# Patient Record
Sex: Female | Born: 1970 | Race: White | Hispanic: No | Marital: Married | Smoking: Former smoker
Health system: Southern US, Community
[De-identification: ages and names within clinical notes are randomized; demographics above are authoritative.]

## PROBLEM LIST (undated history)

## (undated) DIAGNOSIS — E7800 Pure hypercholesterolemia, unspecified: Secondary | ICD-10-CM

## (undated) DIAGNOSIS — M1990 Unspecified osteoarthritis, unspecified site: Secondary | ICD-10-CM

## (undated) DIAGNOSIS — T8859XA Other complications of anesthesia, initial encounter: Secondary | ICD-10-CM

## (undated) DIAGNOSIS — E119 Type 2 diabetes mellitus without complications: Secondary | ICD-10-CM

## (undated) DIAGNOSIS — B009 Herpesviral infection, unspecified: Secondary | ICD-10-CM

## (undated) DIAGNOSIS — K219 Gastro-esophageal reflux disease without esophagitis: Secondary | ICD-10-CM

## (undated) DIAGNOSIS — F419 Anxiety disorder, unspecified: Secondary | ICD-10-CM

## (undated) DIAGNOSIS — I10 Essential (primary) hypertension: Secondary | ICD-10-CM

## (undated) HISTORY — PX: WISDOM TOOTH EXTRACTION: SHX21

## (undated) HISTORY — DX: Essential (primary) hypertension: I10

## (undated) HISTORY — PX: DILATION AND CURETTAGE OF UTERUS: SHX78

## (undated) HISTORY — PX: TUBAL LIGATION: SHX77

## (undated) HISTORY — PX: TONSILLECTOMY: SUR1361

## (undated) HISTORY — DX: Herpesviral infection, unspecified: B00.9

## (undated) HISTORY — DX: Pure hypercholesterolemia, unspecified: E78.00

---

## 1990-03-06 HISTORY — PX: WISDOM TOOTH EXTRACTION: SHX21

## 2005-01-08 ENCOUNTER — Inpatient Hospital Stay: Admission: AD | Admit: 2005-01-08 | Discharge: 2005-01-08 | Payer: Self-pay | Admitting: Gynecology

## 2005-01-09 ENCOUNTER — Inpatient Hospital Stay (HOSPITAL_COMMUNITY): Admission: RE | Admit: 2005-01-09 | Discharge: 2005-01-12 | Payer: Self-pay | Admitting: Gynecology

## 2005-01-09 ENCOUNTER — Encounter (INDEPENDENT_AMBULATORY_CARE_PROVIDER_SITE_OTHER): Payer: Self-pay | Admitting: *Deleted

## 2006-06-05 ENCOUNTER — Other Ambulatory Visit: Admission: RE | Admit: 2006-06-05 | Discharge: 2006-06-05 | Payer: Self-pay | Admitting: Gynecology

## 2007-06-27 ENCOUNTER — Other Ambulatory Visit: Admission: RE | Admit: 2007-06-27 | Discharge: 2007-06-27 | Payer: Self-pay | Admitting: Gynecology

## 2009-08-19 ENCOUNTER — Ambulatory Visit: Payer: Self-pay | Admitting: Gynecology

## 2009-08-19 ENCOUNTER — Other Ambulatory Visit: Admission: RE | Admit: 2009-08-19 | Discharge: 2009-08-19 | Payer: Self-pay | Admitting: Gynecology

## 2009-08-20 ENCOUNTER — Emergency Department (HOSPITAL_COMMUNITY): Admission: EM | Admit: 2009-08-20 | Discharge: 2009-08-21 | Payer: Self-pay | Admitting: Emergency Medicine

## 2009-11-17 ENCOUNTER — Ambulatory Visit: Payer: Self-pay | Admitting: Gynecology

## 2010-05-22 LAB — DIFFERENTIAL
Basophils Absolute: 0 10*3/uL (ref 0.0–0.1)
Basophils Relative: 0 % (ref 0–1)
Eosinophils Absolute: 0.1 10*3/uL (ref 0.0–0.7)
Eosinophils Relative: 1 % (ref 0–5)
Lymphocytes Relative: 9 % — ABNORMAL LOW (ref 12–46)
Lymphs Abs: 0.9 10*3/uL (ref 0.7–4.0)
Monocytes Absolute: 0.6 10*3/uL (ref 0.1–1.0)

## 2010-05-22 LAB — CBC
Hemoglobin: 12.6 g/dL (ref 12.0–15.0)
MCV: 90.6 fL (ref 78.0–100.0)
RBC: 4.11 MIL/uL (ref 3.87–5.11)
RDW: 13.4 % (ref 11.5–15.5)
WBC: 10.1 10*3/uL (ref 4.0–10.5)

## 2010-05-22 LAB — URINALYSIS, ROUTINE W REFLEX MICROSCOPIC
Bilirubin Urine: NEGATIVE
Hgb urine dipstick: NEGATIVE
Protein, ur: NEGATIVE mg/dL
Urobilinogen, UA: 1 mg/dL (ref 0.0–1.0)
pH: 7 (ref 5.0–8.0)

## 2010-05-22 LAB — COMPREHENSIVE METABOLIC PANEL
Albumin: 4 g/dL (ref 3.5–5.2)
Alkaline Phosphatase: 63 U/L (ref 39–117)
Potassium: 3.8 mEq/L (ref 3.5–5.1)
Total Protein: 7.1 g/dL (ref 6.0–8.3)

## 2010-05-22 LAB — URINE MICROSCOPIC-ADD ON

## 2010-07-22 NOTE — H&P (Signed)
NAME:  Latasha Shepard, Latasha Shepard            ACCOUNT NO.:  [id number]   MEDICAL RECORD NO.:  [id number]          PATIENT TYPE:  INP   LOCATION:  NA                            [facility]   PHYSICIAN:  Timothy P. Fontaine, M.D.DATE OF BIRTH:  1970-09-28   DATE OF ADMISSION:  01/09/2005  DATE OF DISCHARGE:                                HISTORY & PHYSICAL   CHIEF COMPLAINT:  1.  Pregnancy at term.  2.  History of prior cesarean section, for repeat cesarean section,  3.  Desires permanent sterilization.  4.  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  5.  Herpes simplex virus.  6.  Type 2 diabetes.   HISTORY OF PRESENT ILLNESS:  The patient is a 40 year old G3, P2 female with  history of two prior cesarean sections at term gestation who desires repeat  cesarean section and tubal sterilization. She is known to have a positive  beta strep history with the previous pregnancy, although has a negative beta  strep culture this pregnancy.  For the remainder of her history, see her  hollister.   PHYSICAL EXAMINATION:  HEENT: Normal.  LUNGS:  Clear.  CARDIAC:  Regular rate.  No rubs, murmurs or gallops.  ABDOMEN:  Gravid, term fundus positive fetal heart tones.  PELVIC:  Deferred.   ASSESSMENT:  A 40 year old G62, P2 female with a history of prior cesarean  section x2 for repeat cesarean section and tubal sterilization. I reviewed  with the patient the options to include trial of labor versus repeat  cesarean section. The expected intraoperative and postoperative courses were  discussed. The absolute sterility associated with a tubal sterilization was  reviewed, the permanency, as well as the potential for failure in the  future, all of which she understands and accepts. The risks of bleeding  necessitating transfusion and the risks of transfusion were reviewed. The  risks of infection, both internal requiring prolonged antibiotics,  incisional requiring opening and draining of incisions, and  closure by  secondary intention was discussed, understood, and accepted. The risks of  inadvertent injury to internal organs including bowel, bladder, ureters,  vessels and nerves necessitating major exploratory reparative surgeries,  future reparative surgeries including ostomy formation, was all discussed,  understood and accepted. The risks of fetal injury to include  musculoskeletal, neural and scalpel injuries were all discussed, understood  and accepted. The patient's questions were answered to her satisfaction. She  is ready to proceed with surgery.      Timothy P. Fontaine, M.D.  Electronically Signed    TPF/MEDQ  D:  12/22/2004  T:  12/22/2004  Job:  [id number]

## 2010-07-22 NOTE — Op Note (Signed)
NAME:  Latasha Shepard, Latasha Shepard            ACCOUNT NO.:  [id number]   MEDICAL RECORD NO.:  [id number]          PATIENT TYPE:  INP   LOCATION:  9124                          [facility]   PHYSICIAN:  Timothy P. Fontaine, M.D.DATE OF BIRTH:  1970-08-18   DATE OF PROCEDURE:  01/09/2005  DATE OF DISCHARGE:                                 OPERATIVE REPORT   PREOPERATIVE DIAGNOSES:  1.  Pregnancy at term.  2.  History of prior cesarean section for repeat cesarean section.  3.  Desires permanent sterilization.  4.  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  5.  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   POSTOPERATIVE DIAGNOSES:  Not given.   PROCEDURE:  1.  Repeat low transverse cervical cesarean section.  2.  Bilateral tubal sterilization, modified Pomeroy technique.   SURGEON:  Timothy P. Fontaine, M.D.   ASSISTANT:  Ivor Costa. Farrel Gobble, M.D.   ANESTHETIC:  Epidural.   COMPLICATIONS:  None.   ESTIMATED BLOOD LOSS:  Less than 500 mL.   SPECIMEN:  1.  Samples of cord blood  2.  Portions of right and portions of left fallopian tube.  3.  Cord blood banking   FINDINGS:  At 3 normal female infant, Apgar's 9 and 9, weight 8 pounds 12  ounces. Pelvic anatomy noted to be normal.   DESCRIPTION OF PROCEDURE:  The patient was taken to the operating room,  underwent epidural anesthesia and was placed in left tilt supine position,  received an abdominal preparation with Betadine solution. Bladder emptied  with an indwelling Foley catheterization, placed in sterile technique per  nursing personnel. The patient was draped in the usual fashion and after  assuring adequate anesthesia, the abdomen was sharply entered through a  repeat Pfannenstiel incision achieving adequate hemostasis at all levels.  The bladder flap was sharply and bluntly developed without difficulty,  uterus sharply entered in the lower uterine segment and bluntly extended  laterally. Bulging membranes were ruptured, the  fluid noted to be clear. The  infant's head delivered through the incision and nares and mouth suctioned.  The rest of the infant delivered, the cord doubly clamped and cut and the  infant was handed to pediatrics in attendance. Samples of cord blood were  obtained. The placenta was spontaneously extruded and sent for cord blood  banking processing. The uterus was exteriorized, the endometrial cavity  explored with a sponge to remove all placental membrane fragments. The  uterine incision was closed in two layers using #0 Vicryl suture first in a  running interlocking stitch followed by an imbricating stitch. The right  fallopian tube was then identified, traced from its insertion to its  fimbriated end and a mid tubal segment was doubly ligated using #0 plain  suture and the intervening segment excised. Adequate hemostasis as well as  tubal lumen was grossly identified. A similar procedure was carried out on  the other side. The uterus was then returned to the abdomen. Both tubal  sites reinspected to assure adequate hemostasis, the abdomen copiously  irrigated. Adequate hemostasis visualized and the anterior fascia was  reapproximated using #  0 Vicryl suture in a running stitch starting at the  angle and meeting in the middle. The subcutaneous tissues were irrigated and  adequate hemostasis achieved with electrocautery. The skin reapproximated  using 4-0 Vicryl in a running subcuticular stitch. Steri-Strips and Benzoin  applied, sterile dressing applied and the patient was taken to the recovery  room in good condition having tolerated the procedure well.      Timothy P. Fontaine, M.D.  Electronically Signed     TPF/MEDQ  D:  01/09/2005  T:  01/09/2005  Job:  [id number]

## 2010-07-22 NOTE — Discharge Summary (Signed)
NAME:  Latasha Shepard, Latasha Shepard            ACCOUNT NO.:  [id number]   MEDICAL RECORD NO.:  [id number]          PATIENT TYPE:  INP   LOCATION:  9124                          [facility]   PHYSICIAN:  Ivor Costa. Farrel Gobble, M.D. DATE OF BIRTH:  28-Jan-1971   DATE OF ADMISSION:  01/09/2005  DATE OF DISCHARGE:                                 DISCHARGE SUMMARY   PRINCIPAL DIAGNOSIS:  Term pregnancy for elective repeat cesarean section.   PRINCIPAL PROCEDURE:  Repeat cesarean section.   ADDITIONAL PROCEDURE:  Bilateral tubal ligation.   HOSPITAL COURSE:  The patient was admitted electively in the morning of  January 09, 2005, and underwent an elective repeat cesarean section with  bilateral tubal ligation by Dr. Audie Box under epidural anesthesia for  delivery of a viable female in the vertex presentation with Apgars of 9 and 9,  birth weight of 8 pounds 12ounces, followed by a bilateral tubal ligation.  Her postoperative course was unremarkable. By postoperative day #3 the  patient was ambulating without difficulty, tolerating a regular diet,  feeding without difficulty, and ready for discharge. Her postoperative exam,  she was afebrile, her vitals were stable. Her abdomen was soft, nontender.  Incision was clean, dry, and intact. Her uterus was firm, nontender, below  the umbilicus. The extremities were also nontender.   DISCHARGE LABORATORY:  Her hemoglobin was 9.9, her hematocrit was 29, her  white count was 10.8, and her platelets were 198.   The patient was discharged home in stable condition with instructions to  follow up in the office in 6 weeks. She was given a prescription for  Percocet preoperatively. She will also use over-the-counter Motrin as  needed.      Ivor Costa. Farrel Gobble, M.D.  Electronically Signed     THL/MEDQ  D:  01/12/2005  T:  01/12/2005  Job:  [id number]

## 2010-07-22 NOTE — H&P (Signed)
NAME:  Latasha Shepard, Latasha Shepard            ACCOUNT NO.:  [id number]   MEDICAL RECORD NO.:  [id number]          PATIENT TYPE:  INP   LOCATION:  9124                          [facility]   PHYSICIAN:  Timothy P. Fontaine, M.D.DATE OF BIRTH:  [birth year]   DATE OF ADMISSION:  01/09/2005  DATE OF DISCHARGE:  01/12/2005                                HISTORY & PHYSICAL   CHIEF COMPLAINT:  1.  Pregnancy at term.  2.  Prior cesarean section x2 for repeat cesarean section.  3.  Desires permanent sterilization.  4.  Positive beta streptococcus carrier.  5.  History of herpes simplex virus carrier.   HISTORY OF PRESENT ILLNESS:  This is a redictation of her history and  physical which apparently was lost in dictation. The patient is a 40-year-  old G1 P2 female at term gestation. History of two prior cesarean sections  for repeat cesarean section and tubal sterilization. The patient has been  counseled as to the risks, benefits, indications and alternatives; the  permanency of sterilization; potential for failure - all of which she  understands and accepts. For the remainder of her history, see her  Hollister.   PHYSICAL EXAMINATION:  HEENT: Normal.  LUNGS:  Clear.  CARDIAC:  Regular rate. No rubs, murmurs or gallops.  ABDOMEN:  Gravid, term uterus. Positive fetal heart tones.  PELVIC:  Deferred.   ASSESSMENT AND PLAN:  A 40 year old G52 P2 female, two prior cesarean  sections, for repeat cesarean section and tubal sterilization. The patient  was counseled for the surgery to include the acute  intraoperative/postoperative risks, as well as the tubal sterilization to  include the permanency and failure risks. The patient's questions were  answered and she is ready to proceed with surgery.   Of note, again, this is a redictation of her history and physical which  apparently had been lost.      Timothy P. Fontaine, M.D.  Electronically Signed     TPF/MEDQ  D:  01/26/2005  T:  01/26/2005   Job:  [id number]

## 2010-11-02 ENCOUNTER — Emergency Department (HOSPITAL_COMMUNITY): Payer: Self-pay

## 2010-11-02 ENCOUNTER — Emergency Department (HOSPITAL_COMMUNITY)
Admission: EM | Admit: 2010-11-02 | Discharge: 2010-11-02 | Disposition: A | Discharge status: Home / Self Care | Payer: Self-pay | Attending: Emergency Medicine | Admitting: Emergency Medicine

## 2010-11-02 ENCOUNTER — Emergency Department (HOSPITAL_COMMUNITY)
Admission: EM | Admit: 2010-11-02 | Discharge: 2010-11-02 | Disposition: A | Discharge status: Other Acute Inpatient Hospital | Payer: Self-pay | Attending: Emergency Medicine | Admitting: Emergency Medicine

## 2010-11-02 DIAGNOSIS — F29 Unspecified psychosis not due to a substance or known physiological condition: Secondary | ICD-10-CM | POA: Insufficient documentation

## 2010-11-02 DIAGNOSIS — G43109 Migraine with aura, not intractable, without status migrainosus: Secondary | ICD-10-CM | POA: Insufficient documentation

## 2010-11-02 DIAGNOSIS — H539 Unspecified visual disturbance: Secondary | ICD-10-CM | POA: Insufficient documentation

## 2010-11-02 DIAGNOSIS — H538 Other visual disturbances: Secondary | ICD-10-CM | POA: Insufficient documentation

## 2010-11-02 DIAGNOSIS — R4182 Altered mental status, unspecified: Secondary | ICD-10-CM | POA: Insufficient documentation

## 2010-11-02 DIAGNOSIS — F411 Generalized anxiety disorder: Secondary | ICD-10-CM | POA: Insufficient documentation

## 2010-11-02 LAB — COMPREHENSIVE METABOLIC PANEL
ALT: 28 U/L (ref 0–35)
AST: 21 U/L (ref 0–37)
Albumin: 4.1 g/dL (ref 3.5–5.2)
Alkaline Phosphatase: 71 U/L (ref 39–117)
BUN: 9 mg/dL (ref 6–23)
CO2: 25 mEq/L (ref 19–32)
Chloride: 101 mEq/L (ref 96–112)
Creatinine, Ser: 0.7 mg/dL (ref 0.50–1.10)
Glucose, Bld: 129 mg/dL — ABNORMAL HIGH (ref 70–99)
Potassium: 3.4 mEq/L — ABNORMAL LOW (ref 3.5–5.1)
Sodium: 136 mEq/L (ref 135–145)
Total Bilirubin: 0.5 mg/dL (ref 0.3–1.2)
Total Protein: 7.6 g/dL (ref 6.0–8.3)

## 2010-11-02 LAB — POCT I-STAT, CHEM 8
Calcium, Ion: 1.06 mmol/L — ABNORMAL LOW (ref 1.12–1.32)
Chloride: 105 mEq/L (ref 96–112)
Creatinine, Ser: 0.8 mg/dL (ref 0.50–1.10)
HCT: 37 % (ref 36.0–46.0)
TCO2: 21 mmol/L (ref 0–100)

## 2010-11-02 LAB — CBC
MCHC: 33.7 g/dL (ref 30.0–36.0)
WBC: 9.3 10*3/uL (ref 4.0–10.5)

## 2010-11-02 LAB — CK TOTAL AND CKMB (NOT AT ARMC)
CK, MB: 1.5 ng/mL (ref 0.3–4.0)
Relative Index: INVALID (ref 0.0–2.5)
Total CK: 89 U/L (ref 7–177)

## 2010-11-02 LAB — DIFFERENTIAL
Basophils Relative: 1 % (ref 0–1)
Lymphs Abs: 2.5 10*3/uL (ref 0.7–4.0)
Monocytes Relative: 7 % (ref 3–12)
Neutro Abs: 6 10*3/uL (ref 1.7–7.7)

## 2010-11-07 NOTE — Consult Note (Signed)
NAME:  Latasha Shepard, Latasha Shepard NO.:  [id number]  MEDICAL RECORD NO.:  [id number]  LOCATION:  [facility]                         FACILITY:  [facility]  PHYSICIAN:  Thana Farr, MD    DATE OF BIRTH:  03/18/1970  DATE OF CONSULTATION:  11/02/2010 DATE OF DISCHARGE:                                CONSULTATION   REFERRING PHYSICIAN:  April Palumbo-Rasch, MD  HISTORY:  Latasha Shepard is a 40 year old female who was in class today and had acute onset of difficulty seeing in Latasha right of Latasha instructor's face.  This area spread outward and upward.  Her visual problems lasted for approximately 15 minutes.  During this time, Latasha Shepard was also confused and in Latasha elevator could not figure out what button to push.  At Latasha parking lot could not find her car and was somewhat anxious as well.  Symptoms resolved after approximately 15 minutes.  Latasha Shepard presented to Lat[facility] ED as a code stroke.  Latasha Shepard now has a pounding headache.  She rates Latasha headache at approximately 3/10.  There is no associated nausea and vomiting at this time.  PAST MEDICAL HISTORY:  Shingles and menstrual migraines.  She has not had a migraine approximately 3 months.  MEDICATIONS:  Acyclovir and Imitrex p.r.n.  SOCIAL HISTORY:  Latasha Shepard has no history of alcohol, tobacco, or illicit drug abuse.  She is married.  PHYSICAL EXAMINATION:  VITAL SIGNS:  Blood pressure 142/92, heart rate 88, and respiratory rate 16. NEUROLOGIC:  Mental status:  Latasha Shepard is alert and oriented.  She can follow commands without difficulty.  Speech is fluent.  Cranial nerve testing II:  Visual field is grossly intact.  Disks flat bilaterally. III, IV, and VI:  Pupils are reactive bilaterally as well as extraocular movements intact.  V and VII:  Smile symmetric.  VIII:  Grossly intact. IX and VII:  Positive gag.  XI:  Bilateral shoulder shrug.  XII: Midline tongue extension.  On motor exam, Latasha Shepard is 5/5  throughout. There is normal tone and bulk.  On sensory testing, pinprick and light touch intact bilaterally.  Deep tendon reflexes are 1+ throughout with absent ankle jerks.  Plantars are downgoing bilaterally.  On cerebellar testing, finger-to-nose and heel-to-shin intact.  LABORATORY DATA:  Sodium 137, potassium 3.5, chloride 105, bicarb 25, BUN 8, creatinine 0.8, and glucose 128.  White blood cell count 9.3, platelet count 309, hemoglobin and hematocrit 12.6 and 37.0 respectively.  PT/INR and PTT 14, 1.06, and 32 respectively.  Total bili 0.5, alk phos 71, SGOT and SGPT 21 and 28 respectively.  Total protein 7.6, albumin 4.1, and calcium 9.4.  Head CT without contrast shows no abnormalities.  ASSESSMENT:  [name] is a 40 year old female with a history of migraines who had presenting complaints of a right superior quadrantanopia and confusion.  Symptoms lasted approximately 15 minutes and resolved.  Latasha Shepard did present as a code stroke but due to her resolution of symptoms tPA was not administered.  Latasha Shepard is now back to baseline.  Differential includes migraine variant versustransient ischemic attack versus seizure.  PLAN: 1. Latasha Shepard will have MRI/MRA of Latasha brain. 2. If above imaging  is unremarkable, Latasha Shepard may be discharged to     home. 3. EEG to be performed.  May be performed as an outpatient if MRI is     unremarkable. 4. Latasha Shepard should refrain from use of Imitrex at this time if she     has further headache due to focal nature of these complaints.          ______________________________ Thana Farr, MD     LR/MEDQ  D:  11/02/2010  T:  11/02/2010  Job:  [id number]  Electronically Signed by Thana Farr MD on 11/07/2010 06:21:53 AM

## 2011-03-15 ENCOUNTER — Encounter: Payer: Self-pay | Admitting: *Deleted

## 2011-03-16 ENCOUNTER — Encounter: Payer: Self-pay | Admitting: Gynecology

## 2011-03-23 ENCOUNTER — Encounter: Payer: Self-pay | Admitting: Gynecology

## 2011-03-29 ENCOUNTER — Encounter: Payer: Self-pay | Admitting: Gynecology

## 2011-03-29 ENCOUNTER — Other Ambulatory Visit (HOSPITAL_COMMUNITY)
Admission: RE | Admit: 2011-03-29 | Discharge: 2011-03-29 | Disposition: A | Discharge status: Home / Self Care | Payer: Self-pay | Source: Ambulatory Visit | Attending: Gynecology | Admitting: Gynecology

## 2011-03-29 ENCOUNTER — Ambulatory Visit (INDEPENDENT_AMBULATORY_CARE_PROVIDER_SITE_OTHER): Payer: Self-pay | Admitting: Gynecology

## 2011-03-29 VITALS — BP 126/80 | Ht 65.0 in | Wt 245.0 lb

## 2011-03-29 DIAGNOSIS — R300 Dysuria: Secondary | ICD-10-CM

## 2011-03-29 DIAGNOSIS — Z13220 Encounter for screening for lipoid disorders: Secondary | ICD-10-CM

## 2011-03-29 DIAGNOSIS — Z131 Encounter for screening for diabetes mellitus: Secondary | ICD-10-CM

## 2011-03-29 DIAGNOSIS — N393 Stress incontinence (female) (male): Secondary | ICD-10-CM

## 2011-03-29 DIAGNOSIS — Z01419 Encounter for gynecological examination (general) (routine) without abnormal findings: Secondary | ICD-10-CM | POA: Insufficient documentation

## 2011-03-29 DIAGNOSIS — B009 Herpesviral infection, unspecified: Secondary | ICD-10-CM

## 2011-03-29 LAB — URINALYSIS W MICROSCOPIC + REFLEX CULTURE
Leukocytes, UA: NEGATIVE
Specific Gravity, Urine: 1.025 (ref 1.005–1.030)
Urobilinogen, UA: 1 mg/dL (ref 0.0–1.0)
pH: 6 (ref 5.0–8.0)

## 2011-03-29 LAB — CBC WITH DIFFERENTIAL/PLATELET
Basophils Relative: 1 % (ref 0–1)
Eosinophils Absolute: 0.2 10*3/uL (ref 0.0–0.7)
Lymphs Abs: 2.4 10*3/uL (ref 0.7–4.0)
MCH: 30.9 pg (ref 26.0–34.0)
Monocytes Absolute: 0.6 10*3/uL (ref 0.1–1.0)

## 2011-03-29 LAB — LIPID PANEL
HDL: 49 mg/dL (ref >39)
Total CHOL/HDL Ratio: 4.2 Ratio
VLDL: 21 mg/dL (ref 0–40)

## 2011-03-29 LAB — GLUCOSE, RANDOM: Glucose, Bld: 83 mg/dL (ref 70–99)

## 2011-03-29 MED ORDER — SULFAMETHOXAZOLE-TRIMETHOPRIM 800-160 MG PO TABS: 1.0000 | ORAL_TABLET | Freq: Once | ORAL | Status: AC

## 2011-03-29 MED ORDER — ACYCLOVIR 400 MG PO TABS: 400.0000 mg | ORAL_TABLET | ORAL | Status: DC

## 2011-03-29 NOTE — Patient Instructions (Signed)
Begin Kegel exercises.  Follow up with urology if you want to pursue bladder sling. Begin exercise program routinely with attention to diet. Follow up in one year for routine GYN exam.

## 2011-03-29 NOTE — Progress Notes (Signed)
Latasha Shepard [birth year] [id number]        41 y.o.  for annual exam.  Notes to issues #1 a day or so after intercourse she'll have end stream dysuria in frequency that lasts for about a day and then goes away.  Has been going on for multiple months. #2 classic SUI symptoms with loss of urine with coughing sneezing laughing.  Past medical history,surgical history, medications, allergies, family history and social history were all reviewed and documented in the EPIC chart. ROS:  Was performed and pertinent positives and negatives are included in the history.  Exam: Sherrilyn Rist chaperone present Filed Vitals:   03/29/11 1139  BP: 126/80   General appearance  Normal Skin grossly normal Head/Neck normal with no cervical or supraclavicular adenopathy thyroid normal Lungs  clear Cardiac RR, without RMG Abdominal  soft, nontender, without masses, organomegaly or hernia Breasts  examined lying and sitting without masses, retractions, discharge or axillary adenopathy. Pelvic  Ext/BUS/vagina  normal   Cervix  normal  Pap done  Uterus  anteverted, normal size, shape and contour, midline and mobile nontender   Adnexa  Without masses or tenderness    Anus and perineum  normal   Rectovaginal  normal sphincter tone without palpated masses or tenderness.    Assessment/Plan:  41 y.o. female for annual exam.   Status post BTL with regular menses. 1. Postcoital cystitis. Classic by history. Her UA today shows some concentration but no overt UTI. Will treat with Macrobid 100 mg postcoital see if this doesn't eliminate her complaint. #30 with several refills given. 2. 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  No overt cystocele.  Options for management include Kegel exercises or consideration for sling procedure reviewed. Patient wants to try Kegel exercises first. If she wants to pursue surgery then she will follow up with urology. 3. History of HSV 2. Patient uses acyclovir 400 mg intermittently for either  treatment with an outbreak she'll take several daily for a day or 2 which eradicates her recurrence or she will take it daily for suppression during a certain time. Acyclovir 400 mg #60 with several refills given. 4. Pap smear. Her last Pap smear 2011 should hyperkeratoses but otherwise was negative. Pap smear was done today. 5. Mammogram. She's never had a mammogram urged her to schedule this she agrees to do so. SBE monthly reviewed. 6. Health maintenance. Baseline CBC, urinalysis, lipid profile, glucose ordered. Assuming she continues well from a gynecologic standpoint and she will see me in a year, sooner as needed.    Dara Lords MD, 12:25 PM 03/29/2011

## 2011-03-30 ENCOUNTER — Other Ambulatory Visit: Payer: Self-pay | Admitting: *Deleted

## 2011-03-30 DIAGNOSIS — E7800 Pure hypercholesterolemia, unspecified: Secondary | ICD-10-CM

## 2011-04-06 ENCOUNTER — Other Ambulatory Visit: Payer: Self-pay

## 2013-07-18 ENCOUNTER — Telehealth: Payer: Self-pay | Admitting: *Deleted

## 2013-07-18 NOTE — Telephone Encounter (Signed)
Pt calling c/o vaginal bleeding since march 2015, last seen in Jan 2013. Pt informed OV best,transferred to front desk

## 2013-07-21 ENCOUNTER — Telehealth: Payer: Self-pay | Admitting: *Deleted

## 2013-07-21 NOTE — Telephone Encounter (Signed)
Pt said bleeding stopped on Friday, is going to cancel appointment for in am with JF

## 2013-07-22 ENCOUNTER — Ambulatory Visit: Payer: Self-pay | Admitting: Gynecology

## 2013-08-05 ENCOUNTER — Ambulatory Visit (INDEPENDENT_AMBULATORY_CARE_PROVIDER_SITE_OTHER): Payer: 59 | Admitting: Gynecology

## 2013-08-05 ENCOUNTER — Encounter: Payer: Self-pay | Admitting: Gynecology

## 2013-08-05 DIAGNOSIS — N926 Irregular menstruation, unspecified: Secondary | ICD-10-CM

## 2013-08-05 MED ORDER — MEGESTROL ACETATE 20 MG PO TABS: 20.0000 mg | ORAL_TABLET | Freq: Two times a day (BID) | ORAL | Status: DC

## 2013-08-05 NOTE — Progress Notes (Signed)
Latasha Shepard [birth year] [id number]        43 y.o.  G4P0013 presents with a history of regular menses until a skip in February. She suddenly started in March and has bled on and off since then. There is bleeding heavy with passage of clots at times. Status post BTL in the past. Recent blood work at her primary physician's last week showed a hemoglobin of 11 and a normal thyroid screen. No other symptoms such as hair loss, weight changes or skin changes.  Past medical history,surgical history, problem list, medications, allergies, family history and social history were all reviewed and documented in the EPIC chart.  Directed ROS with pertinent positives and negatives documented in the history of present illness/assessment and plan.  Exam: Kim assistant General appearance  Normal Abdomen soft without masses, tenderness or guarding. Pelvic external BUS vagina with small clots within the vagina. Cervix normal. Uterus grossly normal in size noting exam limited by abdominal girth. Adnexa without gross masses or tenderness.   Assessment/Plan:  43 y.o. G4P0013 recent episode of irregular bleeding following a skipped menses. Hemoglobin and TSH recently checked. Will check qualitative hCG for completeness. Megace 20 mg twice a day until bleeding slows then once a day until ultrasound. Schedule sonohysterogram in followup for this.   Note: This document was prepared with digital dictation and possible smart phrase technology. Any transcriptional errors that result from this process are unintentional.   Dara Lords MD, 11:37 AM 08/05/2013

## 2013-08-05 NOTE — Patient Instructions (Addendum)
Start the Megace medication twice daily and once the bleeding significantly decrease the dose to once daily. Followup for ultrasound as scheduled in several weeks.

## 2013-08-06 LAB — HCG, SERUM, QUALITATIVE: PREG SERUM: NEGATIVE

## 2013-08-11 ENCOUNTER — Other Ambulatory Visit: Payer: Self-pay | Admitting: Gynecology

## 2013-08-11 DIAGNOSIS — N920 Excessive and frequent menstruation with regular cycle: Secondary | ICD-10-CM

## 2013-08-11 DIAGNOSIS — N926 Irregular menstruation, unspecified: Secondary | ICD-10-CM

## 2013-08-13 ENCOUNTER — Encounter: Payer: Self-pay | Admitting: Gynecology

## 2013-08-13 ENCOUNTER — Ambulatory Visit (INDEPENDENT_AMBULATORY_CARE_PROVIDER_SITE_OTHER): Payer: 59

## 2013-08-13 ENCOUNTER — Other Ambulatory Visit: Payer: Self-pay | Admitting: Gynecology

## 2013-08-13 ENCOUNTER — Ambulatory Visit (INDEPENDENT_AMBULATORY_CARE_PROVIDER_SITE_OTHER): Payer: 59 | Admitting: Gynecology

## 2013-08-13 DIAGNOSIS — N926 Irregular menstruation, unspecified: Secondary | ICD-10-CM

## 2013-08-13 DIAGNOSIS — N852 Hypertrophy of uterus: Secondary | ICD-10-CM

## 2013-08-13 DIAGNOSIS — N8300 Follicular cyst of ovary, unspecified side: Secondary | ICD-10-CM

## 2013-08-13 DIAGNOSIS — N920 Excessive and frequent menstruation with regular cycle: Secondary | ICD-10-CM

## 2013-08-13 DIAGNOSIS — N921 Excessive and frequent menstruation with irregular cycle: Secondary | ICD-10-CM

## 2013-08-13 NOTE — Patient Instructions (Signed)
Call if irregular bleeding continues.

## 2013-08-13 NOTE — Progress Notes (Signed)
Tesha Folger Sep 01, 1970 [id number]        43 y.o.  G4P0013 presents for sonohysterogram with a history of regular menses until a skip in February. She suddenly started in March and has bled on and off since then. There is bleeding heavy with passage of clots at times. Status post BTL in the past. Recent blood work at her primary physician's last week showed a hemoglobin of 11 and a normal thyroid screen. No other symptoms such as hair loss, weight changes or skin changes. Was started on Megace reports that her bleeding has pretty much gone away.   Past medical history,surgical history, problem list, medications, allergies, family history and social history were all reviewed and documented in the EPIC chart.  Directed ROS with pertinent positives and negatives documented in the history of present illness/assessment and plan.  Ultrasound shows uterus normal size. Endometrial echo 7.3 mm. Right and left ovaries normal. Cul-de-sac negative.  Sonohysterogram performed, sterile technique, easy catheter introduction, good distention with no abnormalities. Endometrial sample taken. Patient tolerated well.  Assessment/Plan:  43 y.o. M1D6222 with history of irregular bleeding following a skipped menses. Suspect ovulatory dysfunction. We'll follow up for biopsy results. Keep menstrual calendar. If irregularity continues patient knows to call. If she goes more than 6 weeks without a menses she knows to call for progesterone withdrawal. She'll stop her Megace now and will keep a menstrual calendar.   Note: This document was prepared with digital dictation and possible smart phrase technology. Any transcriptional errors that result from this process are unintentional.   Dara Lords MD, 10:31 AM 08/13/2013

## 2013-09-01 ENCOUNTER — Telehealth: Payer: Self-pay

## 2013-09-01 NOTE — Telephone Encounter (Signed)
Scheduled to see you in the a.m for yearly exam. This was scheduled prior to the recent visits and bleeding issues.  She states she has been bleeding since 6/14 and it is very light. She wonders if you think she should come on for CE in the morning or reschedule.

## 2013-09-01 NOTE — Telephone Encounter (Signed)
Come for exam

## 2013-09-01 NOTE — Telephone Encounter (Signed)
Pt. Informed on cell voice mail.

## 2013-09-02 ENCOUNTER — Ambulatory Visit (INDEPENDENT_AMBULATORY_CARE_PROVIDER_SITE_OTHER): Payer: 59 | Admitting: Gynecology

## 2013-09-02 ENCOUNTER — Encounter: Payer: Self-pay | Admitting: Gynecology

## 2013-09-02 ENCOUNTER — Other Ambulatory Visit (HOSPITAL_COMMUNITY)
Admission: RE | Admit: 2013-09-02 | Discharge: 2013-09-02 | Disposition: A | Discharge status: Home / Self Care | Payer: Managed Care, Other (non HMO) | Source: Ambulatory Visit | Attending: Gynecology | Admitting: Gynecology

## 2013-09-02 VITALS — BP 140/86 | Ht 65.5 in | Wt 275.0 lb

## 2013-09-02 DIAGNOSIS — Z01419 Encounter for gynecological examination (general) (routine) without abnormal findings: Secondary | ICD-10-CM | POA: Insufficient documentation

## 2013-09-02 DIAGNOSIS — Z1151 Encounter for screening for human papillomavirus (HPV): Secondary | ICD-10-CM | POA: Insufficient documentation

## 2013-09-02 DIAGNOSIS — E7800 Pure hypercholesterolemia, unspecified: Secondary | ICD-10-CM | POA: Insufficient documentation

## 2013-09-02 DIAGNOSIS — I10 Essential (primary) hypertension: Secondary | ICD-10-CM | POA: Insufficient documentation

## 2013-09-02 MED ORDER — MEDROXYPROGESTERONE ACETATE 10 MG PO TABS: 10.0000 mg | ORAL_TABLET | Freq: Every day | ORAL | Status: DC

## 2013-09-02 MED ORDER — ACYCLOVIR 400 MG PO TABS: 400.0000 mg | ORAL_TABLET | ORAL | Status: DC

## 2013-09-02 NOTE — Patient Instructions (Signed)
Call if you're a regular bleeding continues. Followup in one year for annual exam  Call to Schedule your mammogram  Facilities in [place]: 1)  [facility] [place] [place] Phone: [phone] 2)  [facility], [phone] Phone: [phone])  Dr. Y[facility]anda [facility]rand at [facility]  [address] Phone: [phone]     Mammogram A mammogram is an X-ray test to find changes in a woman's breast. You should get a mammogram if:  You are 44 years of age or older  You have risk factors.   Your doctor recommends that you have one.  BEFORE THE TEST  Do not schedule the test the week before your period, especially if your breasts are sore during this time.  On the day of your mammogram:  Wash your breasts and armpits well. After washing, do not put on any deodorant or talcum powder on until after your test.   Eat and drink as you usually do.   Take your medicines as usual.   If you are diabetic and take insulin, make sure you:   Eat before coming for your test.   Take your insulin as usual.   If you cannot keep your appointment, call before the appointment to cancel. Schedule another appointment.  TEST  You will need to undress from the waist up. You will put on a hospital gown.   Your breast will be put on the mammogram machine, and it will press firmly on your breast with a piece of plastic called a compression paddle. This will make your breast flatter so that the machine can X-ray all parts of your breast.   Both breasts will be X-rayed. Each breast will be X-rayed from above and from the side. An X-ray might need to be taken again if the picture is not good enough.   The mammogram will last about 15 to 30 minutes.  AFTER THE TEST Finding out the results of your test Ask when your test results will be ready. Make sure you get your test results.  Document Released:  05/19/2008 Document Revised: 02/09/2011 Document Reviewed: 05/19/2008 [facility] Patient Information 2012 [employer] [place].

## 2013-09-02 NOTE — Addendum Note (Signed)
Addended by: Dayna BarkerGARDNER, KIMBERLY K on: 09/02/2013 09:34 AM   Modules accepted: Orders

## 2013-09-02 NOTE — Progress Notes (Signed)
Latasha SkinnerSusan Shepard 04-15-1970 [id number]        43 y.o.  [id number] for annual exam.  Several issues below.   Past medical history,surgical history, problem list, medications, allergies, family history and social history were all reviewed and documented as reviewed in the EPIC chart.  ROS:  12 system ROS performed with pertinent positives and negatives included in the history, assessment and plan.   Additional significant findings :  None   Exam: Kim assistant Filed Vitals:   09/02/13 0857  BP: 140/86  Height: 5' 5.5" (1.664 m)  Weight: 275 lb (124.739 kg)   General appearance:  Normal affect, orientation and appearance. Skin: Grossly normal HEENT: Without gross lesions.  No cervical or supraclavicular adenopathy. Thyroid normal.  Lungs:  Clear without wheezing, rales or rhonchi Cardiac: RR, without RMG Abdominal:  Soft, nontender, without masses, guarding, rebound, organomegaly or hernia Breasts:  Examined lying and sitting without masses, retractions, discharge or axillary adenopathy. Pelvic:  Ext/BUS/vagina normal   Cervix Normal   Uterus  grossly normal midline mobile nontender   Adnexa  Without masses or tenderness    Anus and perineum  Normal   Rectovaginal  Normal sphincter tone without palpated masses or tenderness.    Assessment/Plan:  43 y.o. [id number] female for annual exam, with irregular bleeding, tubal sterilization.   1. Irregular bleeding. History of regular menses through January then skipped February began bleeding in March continuously on and off. Evaluation early June included negative hCG, reported normal TSH, negative sonohysterogram with negative endometrial biopsy. Underwent Megace treatment which resulted bleeding and then stopped it 08/13/2016 started bleeding 08/17/2013 and has spotting on and off since then. Will treat with Provera 10 mg daily x10 days. Followup if irregular bleeding continues. Options to include possible Mirena IUD discussed. Given her  hypertension do not think low dose oral contraceptives good option long-term. Intermittent progesterone withdrawal ulcer discussed. 2. Mammography never. Strongly recommended scheduling screening mammography and she agrees to do so. Names and numbers provided. Breast cancer risk reviewed. SBE monthly reviewed. 3. Pap smear 2013. Pap/HPV today. No history of abnormal Pap smears previously. Repeated 3-5 year interval assuming normal per current screening guidelines. 4. Health maintenance. Mild elevated blood pressure noted. Patient relates not taking her blood pressure medicine yet this morning. No lab work done as patient reports this all done through her primary physician's office. Followup one year, sooner if irregular bleeding continues..    Note: This document was prepared with digital dictation and possible smart phrase technology. Any transcriptional errors that result from this process are unintentional.   Dara LordsFONTAINE,TIMOTHY P MD, 9:22 AM 09/02/2013

## 2013-09-03 LAB — CYTOLOGY - PAP

## 2014-01-05 ENCOUNTER — Encounter: Payer: Self-pay | Admitting: Gynecology

## 2014-08-31 ENCOUNTER — Other Ambulatory Visit: Payer: Self-pay

## 2014-08-31 DIAGNOSIS — Z1231 Encounter for screening mammogram for malignant neoplasm of breast: Secondary | ICD-10-CM

## 2014-09-30 ENCOUNTER — Ambulatory Visit
Admission: RE | Admit: 2014-09-30 | Discharge: 2014-09-30 | Disposition: A | Discharge status: Home / Self Care | Payer: Managed Care, Other (non HMO) | Source: Ambulatory Visit

## 2014-09-30 DIAGNOSIS — Z1231 Encounter for screening mammogram for malignant neoplasm of breast: Secondary | ICD-10-CM

## 2016-04-21 ENCOUNTER — Encounter: Payer: 59 | Admitting: Gynecology

## 2016-07-19 ENCOUNTER — Telehealth (INDEPENDENT_AMBULATORY_CARE_PROVIDER_SITE_OTHER): Payer: Self-pay | Admitting: Orthopaedic Surgery

## 2016-07-19 ENCOUNTER — Encounter: Payer: Self-pay | Admitting: Gynecology

## 2016-07-19 NOTE — Telephone Encounter (Signed)
Patient called stated she is allergic to ansaids and wants to know what medicine she was given several years ago, she said it helped and her new MD wants the name of it. [facility] and told her 08/06/2009 ov note states Dr. Magnus IvanBlackman gave her samples of Celebrex and Zipsor.

## 2016-09-17 ENCOUNTER — Ambulatory Visit (HOSPITAL_COMMUNITY)
Admission: EM | Admit: 2016-09-17 | Discharge: 2016-09-17 | Disposition: A | Discharge status: Home / Self Care | Payer: BLUE CROSS/BLUE SHIELD | Attending: Internal Medicine | Admitting: Internal Medicine

## 2016-09-17 ENCOUNTER — Encounter (HOSPITAL_COMMUNITY): Payer: Self-pay | Admitting: Family Medicine

## 2016-09-17 DIAGNOSIS — I10 Essential (primary) hypertension: Secondary | ICD-10-CM | POA: Diagnosis not present

## 2016-09-17 DIAGNOSIS — N3000 Acute cystitis without hematuria: Secondary | ICD-10-CM

## 2016-09-17 LAB — POCT URINALYSIS DIP (DEVICE)
BILIRUBIN URINE: NEGATIVE
Glucose, UA: NEGATIVE mg/dL
KETONES UR: NEGATIVE mg/dL
Nitrite: NEGATIVE
PH: 5.5 (ref 5.0–8.0)
PROTEIN: 30 mg/dL — AB
Specific Gravity, Urine: 1.025 (ref 1.005–1.030)
Urobilinogen, UA: 0.2 mg/dL (ref 0.0–1.0)

## 2016-09-17 MED ORDER — NITROFURANTOIN MONOHYD MACRO 100 MG PO CAPS: 100.0000 mg | ORAL_CAPSULE | Freq: Two times a day (BID) | ORAL | 0 refills | Status: DC

## 2016-09-17 NOTE — ED Triage Notes (Signed)
Pt here for UTI like symptoms.

## 2016-09-17 NOTE — ED Provider Notes (Signed)
CSN: [id number]     Arrival date & time 09/17/16  1847 History   First MD Initiated Contact with Patient 09/17/16 1908     Chief Complaint  Patient presents with  . Dysuria   (Consider location/radiation/quality/duration/timing/severity/associated sxs/prior Treatment) 46 yo presents with dysuria since this am. History of UTI's in the past. No fever or chills. No back or flank pain is noted.       Past Medical History:  Diagnosis Date  . Herpes   . Hypercholesteremia   . Hypertension    Past Surgical History:  Procedure Laterality Date  . CESAREAN SECTION     x3  . TONSILLECTOMY     90  . TUBAL LIGATION    . WISDOM TOOTH EXTRACTION     6592   Family History  Problem Relation Age of Onset  . Hypertension Mother   . Diabetes Father   . Hypertension Father   . Melanoma Maternal Grandmother   . Stroke Maternal Grandfather   . Heart Problems Paternal Grandfather    Social History  Substance Use Topics  . Smoking status: Former Smoker    Quit date: 03/07/1999  . Smokeless tobacco: Never Used  . Alcohol use Yes     Comment: couple times a week   OB History    Gravida Para Term Preterm AB Living   4 3     1 3    SAB TAB Ectopic Multiple Live Births   1             Review of Systems  All other systems reviewed and are negative.   Allergies  Aspirin and Nsaids  Home Medications   Prior to Admission medications   Medication Sig Start Date End Date Taking? Authorizing Provider  acyclovir (ZOVIRAX) 400 MG tablet Take 1 tablet (400 mg total) by mouth every 4 (four) hours while awake. 09/02/13   Fontaine, Nadyne Coombesimothy P, MD  AmLODIPine Besylate (NORVASC PO) Take by mouth.    [provider]  atorvastatin (LIPITOR) 10 MG tablet Take 10 mg by mouth daily.    [provider]  Citalopram Hydrobromide (CELEXA PO) Take 10 mg by mouth.     [provider]  LISINOPRIL PO Take by mouth.    [provider]  medroxyPROGESTERone (PROVERA) 10 MG  tablet Take 1 tablet (10 mg total) by mouth daily. 09/02/13   Fontaine, Nadyne Coombesimothy P, MD  nitrofurantoin, macrocrystal-monohydrate, (MACROBID) 100 MG capsule Take 1 capsule (100 mg total) by mouth 2 (two) times daily. 09/17/16   Riki SheerYoung, Kyilee Gregg G, PA-C   Meds Ordered and Administered this Visit  Medications - No data to display  BP (!) 143/80   Pulse 98   Temp 97.8 F (36.6 C) (Oral)   Resp 18   SpO2 98%  No data found.   Physical Exam  Constitutional: She appears well-developed and well-nourished. No distress.  Neurological: She is alert.  Skin: Skin is warm and dry. She is not diaphoretic.  Psychiatric: Her behavior is normal.  Nursing note and vitals reviewed.   Urgent Care Course     Procedures (including critical care time)  Labs Review Labs Reviewed  POCT URINALYSIS DIP (DEVICE) - Abnormal; Notable for the following:       Result Value   Hgb urine dipstick LARGE (*)    Protein, ur 30 (*)    Leukocytes, UA MODERATE (*)    All other components within normal limits    Imaging Review No results found.  Visual Acuity Review  Right Eye Distance:   Left Eye Distance:   Bilateral Distance:    Right Eye Near:   Left Eye Near:    Bilateral Near:         MDM   1. Acute cystitis without hematuria   2. Hypertension, unspecified type    Treat with Macrobid, fluids and AZO if needed. FU as needed.     Riki Sheer, [place] 09/17/16 1920

## 2016-09-17 NOTE — Discharge Instructions (Signed)
Nice to meet you. Drink plenty of fluids. Take all of your antibiotics. If worsens f/u or with PCP. Your BP is a little elevated be sure to follow up with PCP for this as well.

## 2018-05-14 ENCOUNTER — Other Ambulatory Visit: Payer: Self-pay | Admitting: Physician Assistant

## 2018-05-14 ENCOUNTER — Other Ambulatory Visit (HOSPITAL_COMMUNITY)
Admission: RE | Admit: 2018-05-14 | Discharge: 2018-05-14 | Disposition: A | Discharge status: Home / Self Care | Payer: BLUE CROSS/BLUE SHIELD | Source: Ambulatory Visit | Attending: Physician Assistant | Admitting: Physician Assistant

## 2018-05-14 DIAGNOSIS — Z01419 Encounter for gynecological examination (general) (routine) without abnormal findings: Secondary | ICD-10-CM | POA: Diagnosis not present

## 2018-05-16 LAB — CYTOLOGY - PAP: Diagnosis: NEGATIVE

## 2019-05-21 ENCOUNTER — Other Ambulatory Visit: Payer: Self-pay | Admitting: Physician Assistant

## 2019-05-21 DIAGNOSIS — Z1231 Encounter for screening mammogram for malignant neoplasm of breast: Secondary | ICD-10-CM

## 2019-06-06 ENCOUNTER — Other Ambulatory Visit: Payer: Self-pay

## 2019-06-06 ENCOUNTER — Ambulatory Visit
Admission: RE | Admit: 2019-06-06 | Discharge: 2019-06-06 | Disposition: A | Discharge status: Home / Self Care | Payer: BC Managed Care – PPO | Source: Ambulatory Visit | Attending: Physician Assistant | Admitting: Physician Assistant

## 2019-06-06 DIAGNOSIS — Z1231 Encounter for screening mammogram for malignant neoplasm of breast: Secondary | ICD-10-CM

## 2020-09-29 ENCOUNTER — Other Ambulatory Visit: Payer: Self-pay | Admitting: Physician Assistant

## 2020-09-29 DIAGNOSIS — Z1231 Encounter for screening mammogram for malignant neoplasm of breast: Secondary | ICD-10-CM

## 2020-10-02 ENCOUNTER — Ambulatory Visit
Admission: RE | Admit: 2020-10-02 | Discharge: 2020-10-02 | Disposition: A | Discharge status: Home / Self Care | Payer: Managed Care, Other (non HMO) | Source: Ambulatory Visit | Attending: Physician Assistant | Admitting: Physician Assistant

## 2020-10-02 DIAGNOSIS — Z1231 Encounter for screening mammogram for malignant neoplasm of breast: Secondary | ICD-10-CM

## 2021-09-28 ENCOUNTER — Other Ambulatory Visit: Payer: Self-pay | Admitting: Physician Assistant

## 2021-09-28 DIAGNOSIS — Z1231 Encounter for screening mammogram for malignant neoplasm of breast: Secondary | ICD-10-CM

## 2021-10-06 ENCOUNTER — Ambulatory Visit: Payer: BC Managed Care – PPO

## 2021-10-12 ENCOUNTER — Ambulatory Visit
Admission: RE | Admit: 2021-10-12 | Discharge: 2021-10-12 | Disposition: A | Discharge status: Home / Self Care | Payer: Managed Care, Other (non HMO) | Source: Ambulatory Visit | Attending: Physician Assistant | Admitting: Physician Assistant

## 2021-10-12 DIAGNOSIS — Z1231 Encounter for screening mammogram for malignant neoplasm of breast: Secondary | ICD-10-CM

## 2022-05-04 NOTE — H&P (Signed)
KNEE ARTHROPLASTY ADMISSION H&P  Patient ID: Latasha Shepard MRN: [id number] DOB/AGE: 53-Nov-1972 53 y.o.  Chief Complaint: left knee pain.  Planned Procedure Date: 05/22/22  Medical Clearance by Ricka Burdock, PA  HPI: Latasha Shepard is a 52 y.o. female who presents for evaluation of OA LEFT KNEE. The patient has a history of pain and functional disability in the left knee due to arthritis and has failed non-surgical conservative treatments for greater than 12 weeks to include NSAID's and/or analgesics, corticosteriod injections, viscosupplementation injections, and activity modification.  Onset of symptoms was gradual, starting >10 years ago with gradually worsening course since that time. The patient noted no past surgery on the left knee.  Patient currently rates pain at 2 out of 10 with activity. Patient has worsening of pain with activity and weight bearing and pain that interferes with activities of daily living.  Patient has evidence of joint space narrowing by imaging studies.  There is no active infection.  Past Medical History:  Diagnosis Date   Herpes    Hypercholesteremia    Hypertension    Past Surgical History:  Procedure Laterality Date   CESAREAN SECTION     x3   TONSILLECTOMY     90   TUBAL LIGATION     WISDOM TOOTH EXTRACTION     89   Allergies  Allergen Reactions   Aspirin Hives   Nsaids Hives   Prior to Admission medications   Medication Sig Start Date End Date Taking? Authorizing Provider  acyclovir (ZOVIRAX) 400 MG tablet Take 1 tablet (400 mg total) by mouth every 4 (four) hours while awake. 09/02/13   Fontaine, Belinda Block, MD  AmLODIPine Besylate (NORVASC PO) Take by mouth.    [provider]  atorvastatin (LIPITOR) 10 MG tablet Take 10 mg by mouth daily.    [provider]  Citalopram Hydrobromide (CELEXA PO) Take 10 mg by mouth.     [provider]  LISINOPRIL PO Take by mouth.    [provider]   medroxyPROGESTERone (PROVERA) 10 MG tablet Take 1 tablet (10 mg total) by mouth daily. 09/02/13   Fontaine, Belinda Block, MD  nitrofurantoin, macrocrystal-monohydrate, (MACROBID) 100 MG capsule Take 1 capsule (100 mg total) by mouth 2 (two) times daily. 09/17/16   Bjorn Pippin, PA-C   Social History   Socioeconomic History   Marital status: Married    Spouse name: Not on file   Number of children: Not on file   Years of education: Not on file   Highest education level: Not on file  Occupational History   Not on file  Tobacco Use   Smoking status: Former    Types: Cigarettes    Quit date: 03/07/1999    Years since quitting: 23.1   Smokeless tobacco: Never  Substance and Sexual Activity   Alcohol use: Yes    Comment: couple times a week   Drug use: No   Sexual activity: Yes    Birth control/protection: Surgical    Comment: BTL  Other Topics Concern   Not on file  Social History Narrative   Not on file   Social Determinants of Health   Financial Resource Strain: Not on file  Food Insecurity: Not on file  Transportation Needs: Not on file  Physical Activity: Not on file  Stress: Not on file  Social Connections: Not on file   Family History  Problem Relation Age of Onset   Breast cancer Mother    Hypertension Mother  Diabetes Father    Hypertension Father    Melanoma Maternal Grandmother    Stroke Maternal Grandfather    Heart Problems Paternal Grandfather     ROS: Currently denies lightheadedness, dizziness, Fever, chills, CP, SOB. No personal history of DVT, PE, MI, or CVA. No loose teeth or dentures All other systems have been reviewed and were otherwise currently negative with the exception of those mentioned in the HPI and as above.  Objective: Vitals: Ht: 5' 5.5" Wt: 211 lbs Temp: 98 BP: 105/75 Pulse: 81 O2 95% on room air.   Physical Exam: General: Alert, NAD.  Antalgic Gait  HEENT: EOMI, Good Neck Extension  Pulm: No increased work of breathing.   Clear B/L A/P w/o crackle or wheeze.  CV: RRR, No m/g/r appreciated  GI: soft, NT, ND Neuro: Neuro without gross focal deficit.  Sensation intact distally Skin: No lesions in the area of chief complaint MSK/Surgical Site: left knee w/o redness or effusion.  Left knee range of motion 10-90 degrees.  Tenderness to the medial and lateral joint line.  Stable to varus and valgus stress.  Negative anterior and posterior drawer.  No pain with hip flexion or internal rotation.  Neurovascularly intact distally.  No significant distal edema.     Imaging Review Plain radiographs demonstrate severe degenerative joint disease of the left knee.   The overall alignment is varus. The bone quality appears to be adequate for age and reported activity level.  Preoperative templating of the joint replacement has been completed, documented, and submitted to the Operating Room personnel in order to optimize intra-operative equipment management.  Assessment: OA LEFT KNEE    Plan: Plan for Procedure(s): TOTAL KNEE ARTHROPLASTY  The patient history, physical exam, clinical judgement of the provider and imaging are consistent with end stage degenerative joint disease and total joint arthroplasty is deemed medically necessary. The treatment options including medical management, injection therapy, and arthroplasty were discussed at length. The risks and benefits of Procedure(s): TOTAL KNEE ARTHROPLASTY were presented and reviewed.  The risks of nonoperative treatment, versus surgical intervention including but not limited to continued pain, aseptic loosening, stiffness, dislocation/subluxation, infection, bleeding, nerve injury, blood clots, cardiopulmonary complications, morbidity, mortality, among others were discussed. The patient verbalizes understanding and wishes to proceed with the plan.  Patient is being admitted for inpatient treatment for surgery, pain control, PT, prophylactic antibiotics, VTE prophylaxis,  progressive ambulation, ADL's and discharge planning.    The patient does meet the criteria for TXA which will be used perioperatively.   ASA 81 mg BID (per discussion with patient and Dr. Zachery Dakins) will be used postoperatively for DVT prophylaxis in addition to SCDs, and early ambulation. The patient is planning to be discharged home with OPPT in care of family   Jola Baptist 05/04/2022 12:52 PM

## 2022-05-09 NOTE — Progress Notes (Signed)
COVID Vaccine received:  '[]'$  No '[x]'$  Yes Date of any COVID positive Test in last 90 days:  None  PCP - Lennie Odor, PA at [place] [facility] [place], [phone] Cardiologist - None  Chest x-ray -  EKG -  will do at PST Stress Test -  ECHO -  Cardiac Cath -   PCR screen: '[x]'$  Ordered & Completed                      '[]'$   No Order but Needs PROFEND                      '[]'$   N/A for this surgery  Surgery Plan:  '[x]'$  Ambulatory                            '[]'$  Outpatient in bed                            '[]'$  Admit  Anesthesia:    '[]'$  General  '[x]'$  Spinal                           '[]'$   Choice '[]'$   MAC  Pacemaker / ICD device '[x]'$  No '[]'$  Yes        Device order form faxed '[x]'$  No    '[]'$   Yes      Faxed to:  Spinal Cord Stimulator:'[x]'$  No '[]'$  Yes       History of Sleep Apnea? '[x]'$  No '[]'$  Yes   CPAP used?- '[x]'$  No '[]'$  Yes    Does the patient monitor blood sugar? '[]'$  No '[x]'$  Yes  '[]'$  N/A  Patient has: '[]'$  Pre-DM   '[]'$  DM1  '[x]'$   DM2 Does patient have a [employer] or Dexacom? '[x]'$  No '[]'$  Yes   Fasting Blood Sugar Ranges- 120-140 Checks Blood Sugar _2 times a month Metformin:  500 mg bid  Blood Thinner / Instructions:  None Aspirin Instructions:  none   ERAS Protocol Ordered: '[]'$  No  '[x]'$  Yes PRE-SURGERY '[]'$  ENSURE  '[x]'$  G2  Patient is to be NPO after: 07:30 am   Activity level: Patient is able to climb a flight of stairs without difficulty; '[x]'$  No CP  '[x]'$  No SOB, but would have leg pain. Patient can / can not perform ADLs without assistance.   Anesthesia review: Pre-DM, HTN,   Patient denies shortness of breath, fever, cough and chest pain at PAT appointment.  Patient verbalized understanding and agreement to the Pre-Surgical Instructions that were given to them at this PAT appointment. Patient was also educated of the need to review these PAT instructions again prior to her surgery.I reviewed the appropriate phone numbers to call if they have any and questions or concerns.

## 2022-05-09 NOTE — Patient Instructions (Signed)
SURGICAL WAITING ROOM VISITATION Patients having surgery or a procedure may have no more than 2 support people in the waiting area - these visitors may rotate in the visitor waiting room.   Due to an increase in RSV and influenza rates and associated hospitalizations, children ages 13 and under may not visit patients in [facility]. If the patient needs to stay at the hospital during part of their recovery, the visitor guidelines for inpatient rooms apply.  PRE-OP VISITATION  Pre-op nurse will coordinate an appropriate time for 1 support person to accompany the patient in pre-op.  This support person may not rotate.  This visitor will be contacted when the time is appropriate for the visitor to come back in the pre-op area.  Please refer to the [facility] website for the visitor guidelines for Inpatients (after your surgery is over and you are in a regular room).  You are not required to quarantine at this time prior to your surgery. However, you must do this: Hand Hygiene often Do NOT share personal items Notify your provider if you are in close contact with someone who has COVID or you develop fever 100.4 or greater, new onset of sneezing, cough, sore throat, shortness of breath or body aches.  If you test positive for Covid or have been in contact with anyone that has tested positive in the last 10 days please notify you surgeon.    Your procedure is scheduled on:  Monday  May 22, 2022  Report to [facility] Main Entrance: [place] entrance where the [employer] is available.   Report to admitting at:  08:00   AM  +++++Call this number if you have any questions or problems the morning of surgery [phone]  Do not eat food after Midnight the night prior to your surgery/procedure.  After Midnight you may have the following liquids until  07:30 AM / PM DAY OF SURGERY  Clear Liquid Diet Water Black Coffee (sugar ok, NO MILK/CREAM OR CREAMERS)  Tea (sugar ok,  NO MILK/CREAM OR CREAMERS) regular and decaf                             Plain Jell-O  with no fruit (NO RED)                                           Fruit ices (not with fruit pulp, NO RED)                                     Popsicles (NO RED)                                                                  Juice: apple, WHITE grape, WHITE cranberry Sports drinks like Gatorade or Powerade (NO RED)                     The day of surgery:  Drink ONE (1) Pre-Surgery G2 at  07:30 AM the morning of surgery. Drink in  one sitting. Do not sip.  This drink was given to you during your hospital pre-op appointment visit. Nothing else to drink after completing the Pre-Surgery G2 : No candy, chewing gum or throat lozenges.    FOLLOW  ANY ADDITIONAL PRE OP INSTRUCTIONS YOU RECEIVED FROM YOUR SURGEON'S OFFICE!!!   Oral Hygiene is also important to reduce your risk of infection.        Remember - BRUSH YOUR TEETH THE MORNING OF SURGERY WITH YOUR REGULAR TOOTHPASTE  Do NOT smoke after Midnight the night before surgery.  Take ONLY these medicines the morning of surgery with A SIP OF WATER: ????   If You have been diagnosed with Sleep Apnea - Bring CPAP mask and tubing day of surgery. We will provide you with a CPAP machine on the day of your surgery.                   You may not have any metal on your body including hair pins, jewelry, and body piercing  Do not wear make-up, lotions, powders, perfumes or deodorant  Do not wear nail polish including gel and S&S, artificial / acrylic nails, or any other type of covering on natural nails including finger and toenails. If you have artificial nails, gel coating, etc., that needs to be removed by a nail salon, Please have this removed prior to surgery. Not doing so may mean that your surgery could be cancelled or delayed if the Surgeon or anesthesia staff feels like they are unable to monitor you safely.   Do not shave 48 hours prior to surgery to  avoid nicks in your skin which may contribute to postoperative infections.    Contacts, Hearing Aids, dentures or bridgework may not be worn into surgery. DENTURES WILL BE REMOVED PRIOR TO SURGERY PLEASE DO NOT APPLY "Poly grip" OR ADHESIVES!!!   Patients discharged on the day of surgery will not be allowed to drive home.  Someone NEEDS to stay with you for the first 24 hours after anesthesia.  Do not bring your home medications to the hospital. The Pharmacy will dispense medications listed on your medication list to you during your admission in the Hospital.  Special Instructions: Bring a copy of your healthcare power of attorney and living will documents the day of surgery, if you wish to have them scanned into your [facility] Medical Records- EPIC  Please read over the following fact sheets you were given: IF YOU HAVE QUESTIONS ABOUT YOUR [place], [place] [phone].    - Preparing for Surgery Before surgery, you can play an important role.  Because skin is not sterile, your skin needs to be as free of germs as possible.  You can reduce the number of germs on your skin by washing with CHG (chlorahexidine gluconate) soap before surgery.  CHG is an antiseptic cleaner which kills germs and bonds with the skin to continue killing germs even after washing. Please DO NOT use if you have an allergy to CHG or antibacterial soaps.  If your skin becomes reddened/irritated stop using the CHG and inform your nurse when you arrive at Short Stay. Do not shave (including legs and underarms) for at least 48 hours prior to the first CHG shower.  You may shave your face/neck.  Please follow these instructions carefully:  1.  Shower with CHG Soap the night before surgery and the  morning of surgery.  2.  If you choose to wash your hair, wash your hair first  as usual with your normal  shampoo.  3.  After you shampoo, rinse your hair and body thoroughly to remove the shampoo.                              4.  Use CHG as you would any other liquid soap.  You can apply chg directly to the skin and wash.  Gently with a scrungie or clean washcloth.  5.  Apply the CHG Soap to your body ONLY FROM THE NECK DOWN.   Do not use on face/ open                           Wound or open sores. Avoid contact with eyes, ears mouth and genitals (private parts).                       Wash face,  Genitals (private parts) with your normal soap.             6.  Wash thoroughly, paying special attention to the area where your  surgery  will be performed.  7.  Thoroughly rinse your body with warm water from the neck down.  8.  DO NOT shower/wash with your normal soap after using and rinsing off the CHG Soap.            9.  Pat yourself dry with a clean towel.            10.  Wear clean pajamas.            11.  Place clean sheets on your bed the night of your first shower and do not  sleep with pets.  ON THE DAY OF SURGERY : Do not apply any lotions/deodorants the morning of surgery.  Please wear clean clothes to the hospital/surgery center.    FAILURE TO FOLLOW THESE INSTRUCTIONS MAY RESULT IN THE CANCELLATION OF YOUR SURGERY  PATIENT SIGNATURE_________________________________  NURSE SIGNATURE__________________________________  ________________________________________________________________________       Adam Phenix    An incentive spirometer is a tool that can help keep your lungs clear and active. This tool measures how well you are filling your lungs with each breath. Taking long deep breaths may help reverse or decrease the chance of developing breathing (pulmonary) problems (especially infection) following: A long period of time when you are unable to move or be active. BEFORE THE PROCEDURE  If the spirometer includes an indicator to show your best effort, your nurse or respiratory therapist will set it to a desired goal. If possible, sit up straight or lean slightly  forward. Try not to slouch. Hold the incentive spirometer in an upright position. INSTRUCTIONS FOR USE  Sit on the edge of your bed if possible, or sit up as far as you can in bed or on a chair. Hold the incentive spirometer in an upright position. Breathe out normally. Place the mouthpiece in your mouth and seal your lips tightly around it. Breathe in slowly and as deeply as possible, raising the piston or the ball toward the top of the column. Hold your breath for 3-5 seconds or for as long as possible. Allow the piston or ball to fall to the bottom of the column. Remove the mouthpiece from your mouth and breathe out normally. Rest for a few seconds and repeat Steps 1 through 7 at least 10 times every 1-2  hours when you are awake. Take your time and take a few normal breaths between deep breaths. The spirometer may include an indicator to show your best effort. Use the indicator as a goal to work toward during each repetition. After each set of 10 deep breaths, practice coughing to be sure your lungs are clear. If you have an incision (the cut made at the time of surgery), support your incision when coughing by placing a pillow or rolled up towels firmly against it. Once you are able to get out of bed, walk around indoors and cough well. You may stop using the incentive spirometer when instructed by your caregiver.  RISKS AND COMPLICATIONS Take your time so you do not get dizzy or light-headed. If you are in pain, you may need to take or ask for pain medication before doing incentive spirometry. It is harder to take a deep breath if you are having pain. AFTER USE Rest and breathe slowly and easily. It can be helpful to keep track of a log of your progress. Your caregiver can provide you with a simple table to help with this. If you are using the spirometer at home, follow these instructions: [place] IF:  You are having difficultly using the spirometer. You have trouble using the  spirometer as often as instructed. Your pain medication is not giving enough relief while using the spirometer. You develop fever of 100.5 F (38.1 C) or higher.                                                                                                    SEEK IMMEDIATE MEDICAL CARE IF:  You cough up bloody sputum that had not been present before. You develop fever of 102 F (38.9 C) or greater. You develop worsening pain at or near the incision site. MAKE SURE YOU:  Understand these instructions. Will watch your condition. Will get help right away if you are not doing well or get worse. Document Released: 07/03/2006 Document Revised: 05/15/2011 Document Reviewed: 09/03/2006 [facility] Patient Information 2014 [place], [place].

## 2022-05-10 ENCOUNTER — Other Ambulatory Visit: Payer: Self-pay

## 2022-05-10 ENCOUNTER — Encounter (HOSPITAL_COMMUNITY): Payer: Self-pay

## 2022-05-10 ENCOUNTER — Encounter (HOSPITAL_COMMUNITY)
Admission: RE | Admit: 2022-05-10 | Discharge: 2022-05-10 | Disposition: A | Discharge status: Home / Self Care | Payer: Managed Care, Other (non HMO) | Source: Ambulatory Visit | Attending: Orthopedic Surgery | Admitting: Orthopedic Surgery

## 2022-05-10 VITALS — BP 106/70 | HR 70 | Temp 98.5°F | Resp 16 | Ht 64.0 in | Wt 211.0 lb

## 2022-05-10 DIAGNOSIS — R9431 Abnormal electrocardiogram [ECG] [EKG]: Secondary | ICD-10-CM | POA: Insufficient documentation

## 2022-05-10 DIAGNOSIS — Z01818 Encounter for other preprocedural examination: Secondary | ICD-10-CM | POA: Insufficient documentation

## 2022-05-10 DIAGNOSIS — R7303 Prediabetes: Secondary | ICD-10-CM | POA: Diagnosis not present

## 2022-05-10 DIAGNOSIS — I10 Essential (primary) hypertension: Secondary | ICD-10-CM | POA: Diagnosis not present

## 2022-05-10 HISTORY — DX: Other complications of anesthesia, initial encounter: T88.59XA

## 2022-05-10 HISTORY — DX: Anxiety disorder, unspecified: F41.9

## 2022-05-10 HISTORY — DX: Gastro-esophageal reflux disease without esophagitis: K21.9

## 2022-05-10 HISTORY — DX: Unspecified osteoarthritis, unspecified site: M19.90

## 2022-05-10 LAB — BASIC METABOLIC PANEL
Anion gap: 9 (ref 5–15)
BUN: 13 mg/dL (ref 6–20)
CO2: 25 mmol/L (ref 22–32)
Calcium: 8.7 mg/dL — ABNORMAL LOW (ref 8.9–10.3)
Chloride: 102 mmol/L (ref 98–111)
Creatinine, Ser: 0.62 mg/dL (ref 0.44–1.00)
GFR, Estimated: >60 mL/min (ref >60)
Glucose, Bld: 86 mg/dL (ref 70–99)
Potassium: 4.3 mmol/L (ref 3.5–5.1)
Sodium: 136 mmol/L (ref 135–145)

## 2022-05-10 LAB — CBC
HCT: 37.7 % (ref 36.0–46.0)
Hemoglobin: 12.1 g/dL (ref 12.0–15.0)
MCH: 29.5 pg (ref 26.0–34.0)
MCHC: 32.1 g/dL (ref 30.0–36.0)
MCV: 92 fL (ref 80.0–100.0)
Platelets: 295 10*3/uL (ref 150–400)
RBC: 4.1 MIL/uL (ref 3.87–5.11)
RDW: 12.9 % (ref 11.5–15.5)
WBC: 8.8 10*3/uL (ref 4.0–10.5)
nRBC: 0 % (ref 0.0–0.2)

## 2022-05-10 LAB — SURGICAL PCR SCREEN
MRSA, PCR: NEGATIVE
Staphylococcus aureus: NEGATIVE

## 2022-05-10 LAB — GLUCOSE, CAPILLARY: Glucose-Capillary: 94 mg/dL (ref 70–99)

## 2022-05-11 LAB — HEMOGLOBIN A1C
Hgb A1c MFr Bld: 5.3 % (ref 4.8–5.6)
Mean Plasma Glucose: 105 mg/dL

## 2022-05-22 ENCOUNTER — Other Ambulatory Visit: Payer: Self-pay

## 2022-05-22 ENCOUNTER — Ambulatory Visit (HOSPITAL_COMMUNITY): Payer: Managed Care, Other (non HMO)

## 2022-05-22 ENCOUNTER — Ambulatory Visit (HOSPITAL_COMMUNITY): Payer: Managed Care, Other (non HMO) | Admitting: Anesthesiology

## 2022-05-22 ENCOUNTER — Encounter (HOSPITAL_COMMUNITY)
Admission: RE | Disposition: A | Discharge status: Home / Self Care | Payer: Self-pay | Source: Home / Self Care | Attending: Orthopedic Surgery

## 2022-05-22 ENCOUNTER — Encounter (HOSPITAL_COMMUNITY): Payer: Self-pay | Admitting: Orthopedic Surgery

## 2022-05-22 ENCOUNTER — Ambulatory Visit (HOSPITAL_BASED_OUTPATIENT_CLINIC_OR_DEPARTMENT_OTHER): Payer: Managed Care, Other (non HMO) | Admitting: Anesthesiology

## 2022-05-22 ENCOUNTER — Observation Stay (HOSPITAL_COMMUNITY)
Admission: RE | Admit: 2022-05-22 | Discharge: 2022-05-23 | Disposition: A | Discharge status: Home / Self Care | Payer: Managed Care, Other (non HMO) | Attending: Orthopedic Surgery | Admitting: Orthopedic Surgery

## 2022-05-22 DIAGNOSIS — I10 Essential (primary) hypertension: Secondary | ICD-10-CM | POA: Insufficient documentation

## 2022-05-22 DIAGNOSIS — Z87891 Personal history of nicotine dependence: Secondary | ICD-10-CM | POA: Diagnosis not present

## 2022-05-22 DIAGNOSIS — M1712 Unilateral primary osteoarthritis, left knee: Secondary | ICD-10-CM | POA: Diagnosis present

## 2022-05-22 DIAGNOSIS — Z79899 Other long term (current) drug therapy: Secondary | ICD-10-CM | POA: Diagnosis not present

## 2022-05-22 DIAGNOSIS — E119 Type 2 diabetes mellitus without complications: Secondary | ICD-10-CM | POA: Insufficient documentation

## 2022-05-22 DIAGNOSIS — R7303 Prediabetes: Secondary | ICD-10-CM

## 2022-05-22 DIAGNOSIS — Z96652 Presence of left artificial knee joint: Secondary | ICD-10-CM

## 2022-05-22 HISTORY — PX: TOTAL KNEE ARTHROPLASTY: SHX125

## 2022-05-22 HISTORY — DX: Type 2 diabetes mellitus without complications: E11.9

## 2022-05-22 LAB — GLUCOSE, CAPILLARY
Glucose-Capillary: 100 mg/dL — ABNORMAL HIGH (ref 70–99)
Glucose-Capillary: 136 mg/dL — ABNORMAL HIGH (ref 70–99)

## 2022-05-22 SURGERY — ARTHROPLASTY, KNEE, TOTAL
Anesthesia: Spinal | Site: Knee | Laterality: Left

## 2022-05-22 MED ORDER — LACTATED RINGERS IV SOLN: INTRAVENOUS | Status: DC

## 2022-05-22 MED ORDER — DEXAMETHASONE SODIUM PHOSPHATE 10 MG/ML IJ SOLN
INTRAMUSCULAR | Status: DC | PRN
  Administered 2022-05-22: 5 mg
  Administered 2022-05-22: 10 mg

## 2022-05-22 MED ORDER — SODIUM CHLORIDE (PF) 0.9 % IJ SOLN
INTRAMUSCULAR | Status: AC
  Filled 2022-05-22: qty 50

## 2022-05-22 MED ORDER — PROPOFOL 500 MG/50ML IV EMUL
INTRAVENOUS | Status: DC | PRN
  Administered 2022-05-22: 100 ug/kg/min via INTRAVENOUS

## 2022-05-22 MED ORDER — POLYVINYL ALCOHOL 1.4 % OP SOLN
1.0000 [drp] | Freq: Two times a day (BID) | OPHTHALMIC | Status: DC
  Administered 2022-05-23: 1 [drp] via OPHTHALMIC
  Filled 2022-05-22: qty 15

## 2022-05-22 MED ORDER — BUPIVACAINE IN DEXTROSE 0.75-8.25 % IT SOLN
INTRATHECAL | Status: DC | PRN
  Administered 2022-05-22: 1.6 mL via INTRATHECAL

## 2022-05-22 MED ORDER — POLYETHYLENE GLYCOL 3350 17 G PO PACK: 17.0000 g | PACK | Freq: Every day | ORAL | Status: DC | PRN

## 2022-05-22 MED ORDER — ISOPROPYL ALCOHOL 70 % SOLN
Status: DC | PRN
  Administered 2022-05-22: 1 via TOPICAL

## 2022-05-22 MED ORDER — OXYCODONE HCL 5 MG PO TABS
5.0000 mg | ORAL_TABLET | ORAL | Status: DC | PRN
  Administered 2022-05-22 – 2022-05-23 (×5): 10 mg via ORAL
  Filled 2022-05-22 (×4): qty 2

## 2022-05-22 MED ORDER — ROPIVACAINE HCL 5 MG/ML IJ SOLN
INTRAMUSCULAR | Status: DC | PRN
  Administered 2022-05-22: 20 mL via PERINEURAL

## 2022-05-22 MED ORDER — SODIUM CHLORIDE (PF) 0.9 % IJ SOLN
INTRAMUSCULAR | Status: DC | PRN
  Administered 2022-05-22: 60 mL

## 2022-05-22 MED ORDER — AMISULPRIDE (ANTIEMETIC) 5 MG/2ML IV SOLN: 10.0000 mg | Freq: Once | INTRAVENOUS | Status: DC | PRN

## 2022-05-22 MED ORDER — SODIUM CHLORIDE 0.9 % IR SOLN
Status: DC | PRN
  Administered 2022-05-22: 3000 mL

## 2022-05-22 MED ORDER — DOCUSATE SODIUM 100 MG PO CAPS
100.0000 mg | ORAL_CAPSULE | Freq: Two times a day (BID) | ORAL | Status: DC
  Administered 2022-05-22 – 2022-05-23 (×2): 100 mg via ORAL
  Filled 2022-05-22 (×2): qty 1

## 2022-05-22 MED ORDER — OXYCODONE HCL 5 MG PO TABS: 5.0000 mg | ORAL_TABLET | Freq: Once | ORAL | Status: DC | PRN

## 2022-05-22 MED ORDER — PROPOFOL 10 MG/ML IV BOLUS
INTRAVENOUS | Status: DC | PRN
  Administered 2022-05-22: 20 mg via INTRAVENOUS
  Administered 2022-05-22 (×4): 30 mg via INTRAVENOUS

## 2022-05-22 MED ORDER — ASPIRIN 81 MG PO CHEW
81.0000 mg | CHEWABLE_TABLET | Freq: Two times a day (BID) | ORAL | Status: DC
  Administered 2022-05-23: 81 mg via ORAL
  Filled 2022-05-22: qty 1

## 2022-05-22 MED ORDER — PHENYLEPHRINE HCL-NACL 20-0.9 MG/250ML-% IV SOLN
INTRAVENOUS | Status: DC | PRN
  Administered 2022-05-22: 20 ug/min via INTRAVENOUS

## 2022-05-22 MED ORDER — ONDANSETRON HCL 4 MG PO TABS
4.0000 mg | ORAL_TABLET | Freq: Four times a day (QID) | ORAL | Status: DC | PRN
  Administered 2022-05-22: 4 mg via ORAL
  Filled 2022-05-22: qty 1

## 2022-05-22 MED ORDER — METHOCARBAMOL 500 MG PO TABS
500.0000 mg | ORAL_TABLET | Freq: Four times a day (QID) | ORAL | Status: DC | PRN
  Administered 2022-05-22 – 2022-05-23 (×2): 500 mg via ORAL
  Filled 2022-05-22 (×2): qty 1

## 2022-05-22 MED ORDER — FENTANYL CITRATE PF 50 MCG/ML IJ SOSY: 25.0000 ug | PREFILLED_SYRINGE | INTRAMUSCULAR | Status: DC | PRN

## 2022-05-22 MED ORDER — MENTHOL 3 MG MT LOZG: 1.0000 | LOZENGE | OROMUCOSAL | Status: DC | PRN

## 2022-05-22 MED ORDER — ONDANSETRON HCL 4 MG/2ML IJ SOLN
INTRAMUSCULAR | Status: AC
  Filled 2022-05-22: qty 2

## 2022-05-22 MED ORDER — METHOCARBAMOL 500 MG IVPB - SIMPLE MED: 500.0000 mg | Freq: Four times a day (QID) | INTRAVENOUS | Status: DC | PRN

## 2022-05-22 MED ORDER — ACETAMINOPHEN 325 MG PO TABS: 325.0000 mg | ORAL_TABLET | Freq: Four times a day (QID) | ORAL | Status: DC | PRN

## 2022-05-22 MED ORDER — PROPOFOL 500 MG/50ML IV EMUL
INTRAVENOUS | Status: AC
  Filled 2022-05-22: qty 50

## 2022-05-22 MED ORDER — FENTANYL CITRATE (PF) 100 MCG/2ML IJ SOLN
INTRAMUSCULAR | Status: AC
  Filled 2022-05-22: qty 2

## 2022-05-22 MED ORDER — CEFAZOLIN SODIUM-DEXTROSE 2-4 GM/100ML-% IV SOLN
2.0000 g | INTRAVENOUS | Status: AC
  Administered 2022-05-22: 2 g via INTRAVENOUS
  Filled 2022-05-22: qty 100

## 2022-05-22 MED ORDER — FENTANYL CITRATE (PF) 100 MCG/2ML IJ SOLN
INTRAMUSCULAR | Status: DC | PRN
  Administered 2022-05-22 (×2): 50 ug via INTRAVENOUS

## 2022-05-22 MED ORDER — PROPOFOL 500 MG/50ML IV EMUL
INTRAVENOUS | Status: AC
  Filled 2022-05-22: qty 100

## 2022-05-22 MED ORDER — BUPIVACAINE LIPOSOME 1.3 % IJ SUSP
INTRAMUSCULAR | Status: DC | PRN
  Administered 2022-05-22: 20 mL

## 2022-05-22 MED ORDER — DEXAMETHASONE SODIUM PHOSPHATE 10 MG/ML IJ SOLN
INTRAMUSCULAR | Status: AC
  Filled 2022-05-22: qty 1

## 2022-05-22 MED ORDER — HYDROMORPHONE HCL 1 MG/ML IJ SOLN: 0.5000 mg | INTRAMUSCULAR | Status: DC | PRN

## 2022-05-22 MED ORDER — METHOCARBAMOL 500 MG PO TABS: 500.0000 mg | ORAL_TABLET | Freq: Three times a day (TID) | ORAL | 0 refills | Status: AC | PRN

## 2022-05-22 MED ORDER — ACETAMINOPHEN 500 MG PO TABS
1000.0000 mg | ORAL_TABLET | Freq: Once | ORAL | Status: AC
  Administered 2022-05-22: 500 mg via ORAL
  Filled 2022-05-22: qty 2

## 2022-05-22 MED ORDER — MIDAZOLAM HCL 2 MG/2ML IJ SOLN
1.0000 mg | Freq: Once | INTRAMUSCULAR | Status: AC
  Administered 2022-05-22: 2 mg via INTRAVENOUS
  Filled 2022-05-22: qty 2

## 2022-05-22 MED ORDER — AMLODIPINE BESYLATE 10 MG PO TABS
10.0000 mg | ORAL_TABLET | Freq: Every day | ORAL | Status: DC
  Administered 2022-05-22: 10 mg via ORAL
  Filled 2022-05-22: qty 1

## 2022-05-22 MED ORDER — FENTANYL CITRATE PF 50 MCG/ML IJ SOSY
50.0000 ug | PREFILLED_SYRINGE | Freq: Once | INTRAMUSCULAR | Status: AC
  Administered 2022-05-22: 50 ug via INTRAVENOUS
  Filled 2022-05-22: qty 2

## 2022-05-22 MED ORDER — TRANEXAMIC ACID-NACL 1000-0.7 MG/100ML-% IV SOLN
1000.0000 mg | INTRAVENOUS | Status: AC
  Administered 2022-05-22: 1000 mg via INTRAVENOUS
  Filled 2022-05-22: qty 100

## 2022-05-22 MED ORDER — OXYCODONE HCL 5 MG PO TABS
ORAL_TABLET | ORAL | Status: AC
  Filled 2022-05-22: qty 2

## 2022-05-22 MED ORDER — WATER FOR IRRIGATION, STERILE IR SOLN
Status: DC | PRN
  Administered 2022-05-22: 2000 mL

## 2022-05-22 MED ORDER — SODIUM CHLORIDE (PF) 0.9 % IJ SOLN
INTRAMUSCULAR | Status: AC
  Filled 2022-05-22: qty 10

## 2022-05-22 MED ORDER — ASPIRIN 81 MG PO TBEC: 81.0000 mg | DELAYED_RELEASE_TABLET | Freq: Two times a day (BID) | ORAL | 0 refills | Status: AC

## 2022-05-22 MED ORDER — ORAL CARE MOUTH RINSE: 15.0000 mL | Freq: Once | OROMUCOSAL | Status: AC

## 2022-05-22 MED ORDER — 0.9 % SODIUM CHLORIDE (POUR BTL) OPTIME
TOPICAL | Status: DC | PRN
  Administered 2022-05-22: 1000 mL

## 2022-05-22 MED ORDER — ONDANSETRON HCL 4 MG PO TABS: 4.0000 mg | ORAL_TABLET | Freq: Three times a day (TID) | ORAL | 0 refills | Status: AC | PRN

## 2022-05-22 MED ORDER — LACTATED RINGERS IV BOLUS: 500.0000 mL | Freq: Once | INTRAVENOUS | Status: DC

## 2022-05-22 MED ORDER — CITALOPRAM HYDROBROMIDE 20 MG PO TABS
10.0000 mg | ORAL_TABLET | Freq: Every day | ORAL | Status: DC
  Administered 2022-05-22: 10 mg via ORAL
  Filled 2022-05-22: qty 1

## 2022-05-22 MED ORDER — POVIDONE-IODINE 10 % EX SWAB: 2.0000 | Freq: Once | CUTANEOUS | Status: DC

## 2022-05-22 MED ORDER — OXYCODONE HCL 5 MG/5ML PO SOLN: 5.0000 mg | Freq: Once | ORAL | Status: DC | PRN

## 2022-05-22 MED ORDER — LACTATED RINGERS IV BOLUS: 250.0000 mL | Freq: Once | INTRAVENOUS | Status: DC

## 2022-05-22 MED ORDER — OXYCODONE HCL 5 MG PO TABS: 5.0000 mg | ORAL_TABLET | ORAL | 0 refills | Status: AC | PRN

## 2022-05-22 MED ORDER — PHENOL 1.4 % MT LIQD: 1.0000 | OROMUCOSAL | Status: DC | PRN

## 2022-05-22 MED ORDER — POVIDONE-IODINE 7.5 % EX SOLN: Freq: Once | CUTANEOUS | Status: DC

## 2022-05-22 MED ORDER — CHLORHEXIDINE GLUCONATE 0.12 % MT SOLN
15.0000 mL | Freq: Once | OROMUCOSAL | Status: AC
  Administered 2022-05-22: 15 mL via OROMUCOSAL

## 2022-05-22 MED ORDER — PANTOPRAZOLE SODIUM 40 MG PO TBEC
40.0000 mg | DELAYED_RELEASE_TABLET | Freq: Every day | ORAL | Status: DC
  Administered 2022-05-23: 40 mg via ORAL
  Filled 2022-05-22: qty 1

## 2022-05-22 MED ORDER — ACETAMINOPHEN 500 MG PO TABS
1000.0000 mg | ORAL_TABLET | Freq: Four times a day (QID) | ORAL | Status: AC
  Administered 2022-05-22 – 2022-05-23 (×4): 1000 mg via ORAL
  Filled 2022-05-22 (×3): qty 2

## 2022-05-22 MED ORDER — POVIDONE-IODINE 10 % EX SWAB
2.0000 | Freq: Once | CUTANEOUS | Status: AC
  Administered 2022-05-22: 2 via TOPICAL

## 2022-05-22 MED ORDER — OXYCODONE HCL 5 MG PO TABS
ORAL_TABLET | ORAL | Status: AC
  Administered 2022-05-22: 10 mg via ORAL
  Filled 2022-05-22: qty 2

## 2022-05-22 MED ORDER — CEFAZOLIN SODIUM-DEXTROSE 2-4 GM/100ML-% IV SOLN
2.0000 g | Freq: Four times a day (QID) | INTRAVENOUS | Status: AC
  Administered 2022-05-23: 2 g via INTRAVENOUS
  Filled 2022-05-22: qty 100

## 2022-05-22 MED ORDER — ATORVASTATIN CALCIUM 40 MG PO TABS
80.0000 mg | ORAL_TABLET | Freq: Every day | ORAL | Status: DC
  Administered 2022-05-22: 80 mg via ORAL
  Filled 2022-05-22: qty 2

## 2022-05-22 MED ORDER — LIDOCAINE HCL (CARDIAC) PF 100 MG/5ML IV SOSY
PREFILLED_SYRINGE | INTRAVENOUS | Status: DC | PRN
  Administered 2022-05-22: 100 mg via INTRAVENOUS

## 2022-05-22 MED ORDER — DEXAMETHASONE SODIUM PHOSPHATE 10 MG/ML IJ SOLN: 8.0000 mg | Freq: Once | INTRAMUSCULAR | Status: DC

## 2022-05-22 MED ORDER — POVIDONE (PF) 0.5 % OP SOLN: 1.0000 [drp] | Freq: Two times a day (BID) | OPHTHALMIC | Status: DC

## 2022-05-22 MED ORDER — LISINOPRIL 10 MG PO TABS
10.0000 mg | ORAL_TABLET | Freq: Every day | ORAL | Status: DC
  Administered 2022-05-22: 10 mg via ORAL
  Filled 2022-05-22: qty 1

## 2022-05-22 MED ORDER — ONDANSETRON HCL 4 MG/2ML IJ SOLN: 4.0000 mg | Freq: Four times a day (QID) | INTRAMUSCULAR | Status: DC | PRN

## 2022-05-22 MED ORDER — ONDANSETRON HCL 4 MG/2ML IJ SOLN
INTRAMUSCULAR | Status: DC | PRN
  Administered 2022-05-22: 4 mg via INTRAVENOUS

## 2022-05-22 MED ORDER — DIPHENHYDRAMINE HCL 12.5 MG/5ML PO ELIX: 12.5000 mg | ORAL_SOLUTION | ORAL | Status: DC | PRN

## 2022-05-22 MED ORDER — ACETAMINOPHEN 500 MG PO TABS
ORAL_TABLET | ORAL | Status: AC
  Filled 2022-05-22: qty 2

## 2022-05-22 MED ORDER — METFORMIN HCL 500 MG PO TABS
500.0000 mg | ORAL_TABLET | Freq: Two times a day (BID) | ORAL | Status: DC
  Administered 2022-05-23: 500 mg via ORAL
  Filled 2022-05-22: qty 1

## 2022-05-22 MED ORDER — ACETAMINOPHEN 500 MG PO TABS: 1000.0000 mg | ORAL_TABLET | Freq: Three times a day (TID) | ORAL | 0 refills | Status: AC | PRN

## 2022-05-22 MED ORDER — BUPIVACAINE LIPOSOME 1.3 % IJ SUSP
INTRAMUSCULAR | Status: AC
  Filled 2022-05-22: qty 20

## 2022-05-22 MED ORDER — GLYCOPYRROLATE 0.2 MG/ML IJ SOLN
INTRAMUSCULAR | Status: DC | PRN
  Administered 2022-05-22: .2 mg via INTRAVENOUS

## 2022-05-22 MED ORDER — BUPIVACAINE LIPOSOME 1.3 % IJ SUSP: 20.0000 mL | Freq: Once | INTRAMUSCULAR | Status: DC

## 2022-05-22 SURGICAL SUPPLY — 67 items
ADH SKN CLS APL DERMABOND .7 (GAUZE/BANDAGES/DRESSINGS) ×1
APL PRP STRL LF DISP 70% ISPRP (MISCELLANEOUS) ×2
BAG COUNTER SPONGE SURGICOUNT (BAG) IMPLANT
BAG SPNG CNTER NS LX DISP (BAG) ×1
BLADE SAG 18X100X1.27 (BLADE) ×1 IMPLANT
BLADE SAW SAG 35X64 .89 (BLADE) ×1 IMPLANT
BNDG CMPR 5X3 CHSV STRCH STRL (GAUZE/BANDAGES/DRESSINGS) ×1
BNDG CMPR MED 10X6 ELC LF (GAUZE/BANDAGES/DRESSINGS) ×1
BNDG COHESIVE 3X5 TAN ST LF (GAUZE/BANDAGES/DRESSINGS) ×1 IMPLANT
BNDG ELASTIC 6X10 VLCR STRL LF (GAUZE/BANDAGES/DRESSINGS) ×1 IMPLANT
BOWL SMART MIX CTS (DISPOSABLE) ×1 IMPLANT
BSPLAT TIB 5D C CMNT STM LT (Knees) ×1 IMPLANT
CEMENT BONE R 1X40 (Cement) IMPLANT
CEMENT BONE REFOBACIN R1X40 US (Cement) IMPLANT
CHLORAPREP W/TINT 26 (MISCELLANEOUS) ×2 IMPLANT
CLSR STERI-STRIP ANTIMIC 1/2X4 (GAUZE/BANDAGES/DRESSINGS) IMPLANT
COMP FEM CMT PS N 4 LT (Joint) ×1 IMPLANT
COMPONENT FEM CMT PS N 4 LT (Joint) IMPLANT
COVER SURGICAL LIGHT HANDLE (MISCELLANEOUS) ×1 IMPLANT
CUFF TOURN SGL QUICK 34 (TOURNIQUET CUFF) ×1
CUFF TRNQT CYL 34X4.125X (TOURNIQUET CUFF) ×1 IMPLANT
DERMABOND ADVANCED .7 DNX12 (GAUZE/BANDAGES/DRESSINGS) ×1 IMPLANT
DRAPE INCISE IOBAN 85X60 (DRAPES) ×1 IMPLANT
DRAPE SHEET LG 3/4 BI-LAMINATE (DRAPES) ×1 IMPLANT
DRAPE U-SHAPE 47X51 STRL (DRAPES) ×1 IMPLANT
DRESSING AQUACEL AG SP 3.5X10 (GAUZE/BANDAGES/DRESSINGS) ×1 IMPLANT
DRSG AQUACEL AG ADV 3.5X10 (GAUZE/BANDAGES/DRESSINGS) IMPLANT
DRSG AQUACEL AG SP 3.5X10 (GAUZE/BANDAGES/DRESSINGS) ×1
ELECT REM PT RETURN 15FT ADLT (MISCELLANEOUS) ×1 IMPLANT
GAUZE SPONGE 4X4 12PLY STRL (GAUZE/BANDAGES/DRESSINGS) ×1 IMPLANT
GLOVE BIO SURGEON STRL SZ 6.5 (GLOVE) ×2 IMPLANT
GLOVE BIOGEL PI IND STRL 6.5 (GLOVE) ×1 IMPLANT
GLOVE BIOGEL PI IND STRL 8 (GLOVE) ×1 IMPLANT
GLOVE SURG ORTHO 8.0 STRL STRW (GLOVE) ×2 IMPLANT
GOWN STRL REUS W/ TWL XL LVL3 (GOWN DISPOSABLE) ×2 IMPLANT
GOWN STRL REUS W/TWL XL LVL3 (GOWN DISPOSABLE) ×2
HANDPIECE INTERPULSE COAX TIP (DISPOSABLE) ×1
HDLS TROCR DRIL PIN KNEE 75 (PIN) ×1
HOLDER FOLEY CATH W/STRAP (MISCELLANEOUS) ×1 IMPLANT
HOOD PEEL AWAY T7 (MISCELLANEOUS) ×3 IMPLANT
INSERT AS PERS 4-5 C-D 12 LT (Insert) IMPLANT
KIT TURNOVER KIT A (KITS) IMPLANT
MANIFOLD NEPTUNE II (INSTRUMENTS) ×1 IMPLANT
MARKER SKIN DUAL TIP RULER LAB (MISCELLANEOUS) ×1 IMPLANT
NS IRRIG 1000ML POUR BTL (IV SOLUTION) ×1 IMPLANT
PACK TOTAL KNEE CUSTOM (KITS) ×1 IMPLANT
PIN DRILL HDLS TROCAR 75 4PK (PIN) IMPLANT
SCREW HEADED 33MM KNEE (MISCELLANEOUS) IMPLANT
SET HNDPC FAN SPRY TIP SCT (DISPOSABLE) ×1 IMPLANT
SOLUTION IRRIG SURGIPHOR (IV SOLUTION) IMPLANT
SPIKE FLUID TRANSFER (MISCELLANEOUS) ×1 IMPLANT
STEM POLY PAT PLY 29M KNEE (Knees) IMPLANT
STEM TIBIA 5 DEG SZ C L KNEE (Knees) IMPLANT
STRIP CLOSURE SKIN 1/2X4 (GAUZE/BANDAGES/DRESSINGS) ×1 IMPLANT
SUT MNCRL AB 3-0 PS2 18 (SUTURE) ×1 IMPLANT
SUT STRATAFIX 0 PDS 27 VIOLET (SUTURE) ×1
SUT STRATAFIX PDO 1 14 VIOLET (SUTURE) ×1
SUT STRATFX PDO 1 14 VIOLET (SUTURE) ×1
SUT VIC AB 2-0 CT2 27 (SUTURE) ×2 IMPLANT
SUTURE STRATFX 0 PDS 27 VIOLET (SUTURE) ×1 IMPLANT
SUTURE STRATFX PDO 1 14 VIOLET (SUTURE) ×1 IMPLANT
SYR 50ML LL SCALE MARK (SYRINGE) ×1 IMPLANT
TIBIA STEM 5 DEG SZ C L KNEE (Knees) ×1 IMPLANT
TRAY FOLEY MTR SLVR 14FR STAT (SET/KITS/TRAYS/PACK) IMPLANT
TUBE SUCTION HIGH CAP CLEAR NV (SUCTIONS) ×1 IMPLANT
UNDERPAD 30X36 HEAVY ABSORB (UNDERPADS AND DIAPERS) ×1 IMPLANT
WRAP KNEE MAXI GEL POST OP (GAUZE/BANDAGES/DRESSINGS) IMPLANT

## 2022-05-22 NOTE — Anesthesia Postprocedure Evaluation (Signed)
Anesthesia Post Note  Patient: Latasha Shepard  Procedure(s) Performed: TOTAL KNEE ARTHROPLASTY (Left: Knee)     Patient location during evaluation: PACU Anesthesia Type: Spinal Level of consciousness: awake and alert Pain management: pain level controlled Vital Signs Assessment: post-procedure vital signs reviewed and stable Respiratory status: spontaneous breathing, nonlabored ventilation and respiratory function stable Cardiovascular status: blood pressure returned to baseline Postop Assessment: no apparent nausea or vomiting, spinal receding, no headache and no backache Anesthetic complications: no   No notable events documented.  Last Vitals:  Vitals:   05/22/22 1630 05/22/22 1645  BP: 106/74 111/72  Pulse: 84 73  Resp: 15 14  Temp:    SpO2: 96% 97%    Last Pain:  Vitals:   05/22/22 1645  TempSrc:   PainSc: [place]

## 2022-05-22 NOTE — Interval H&P Note (Signed)

## 2022-05-22 NOTE — Evaluation (Signed)
Physical Therapy Evaluation Patient Details Name: Latasha Shepard MRN: [id number] DOB: [birth year] Today's Date: 05/22/2022  History of Present Illness  52 yo female presents to therapy s/p L TKA on 05/22/2022 due to failure of conservative measures. Pt PMH includes but is not limited to: HDL, HTN, herpes and OA.  Clinical Impression    Latasha Shepard is a 52 y.o. female POD 0 s/p L TKA. Patient reports IND with mobility at baseline. Patient is now limited by functional impairments (see PT problem list below) and requires min A for bed mobility, mod A for STS and unable to safely complete transfers or gait tasks due to LLE instability and numbness once in standing.  for transfers and gait with RW. Patient instructed in exercises to facilitate ROM and circulation while in supine, reviewed and HO provided. Patient will benefit from continued skilled PT interventions to address impairments and progress towards PLOF. Patient has not met mobility goals at this time and PT anticipates need for re-assessment vs inpt stay and will follow acutely.     Recommendations for follow up therapy are one component of a multi-disciplinary discharge planning process, led by the attending physician.  Recommendations may be updated based on patient status, additional functional criteria and insurance authorization.  Follow Up Recommendations Outpatient PT      Assistance Recommended at Discharge Frequent or constant Supervision/Assistance  Patient can return home with the following  A little help with walking and/or transfers;A little help with bathing/dressing/bathroom;Assistance with cooking/housework;Assist for transportation;Help with stairs or ramp for entrance    Equipment Recommendations Rolling walker (2 wheels) (provided and adjusted)  Recommendations for Other Services       Functional Status Assessment Patient has had a recent decline in their functional status and demonstrates the ability to make  significant improvements in function in a reasonable and predictable amount of time.     Precautions / Restrictions Precautions Precautions: Fall;Knee Restrictions Weight Bearing Restrictions: No      Mobility  Bed Mobility Overal bed mobility: Needs Assistance Bed Mobility: Supine to Sit, Sit to Supine     Supine to sit: Min guard Sit to supine: Min assist (for LLE)   General bed mobility comments: pt requires cues, increased time and physical assist    Transfers Overall transfer level: Needs assistance Equipment used: Rolling walker (2 wheels) Transfers: Sit to/from Stand Sit to Stand: Mod assist           General transfer comment: pt unable to demonstrate L knee extension required for static standing and transfer tasks and required mod A to maintain balance    Ambulation/Gait               General Gait Details: NT due to LLE instability  Stairs            Wheelchair Mobility    Modified Rankin (Stroke Patients Only)       Balance                                             Pertinent Vitals/Pain Pain Assessment Pain Assessment: 0-10 Pain Score: 2  Pain Location: L knee Pain Descriptors / Indicators: Aching, Constant, Operative site guarding Pain Intervention(s): Limited activity within patient's tolerance, Monitored during session, Premedicated before session, Ice applied    Home Living Family/patient expects to be discharged to:: Private residence Living Arrangements: Spouse/significant other;Children  Available Help at Discharge: Family;Available 24 hours/day Type of Home: House Home Access: Stairs to enter Entrance Stairs-Rails: Right Entrance Stairs-Number of Steps: 3   Home Layout: Two level;Able to live on main level with bedroom/bathroom Home Equipment: BSC/3in1;Shower seat - built in (ice man machine)      Prior Function Prior Level of Function : Independent/Modified Independent;Driving              Mobility Comments: IND with ADLs, self care tasks, IADLs       Hand Dominance        Extremity/Trunk Assessment        Lower Extremity Assessment Lower Extremity Assessment: LLE deficits/detail LLE Deficits / Details: ankle DF/PF 5/5, pt requires AA for SLR and unable to perofrm SAQ or LAQ seated EOB LLE Sensation: WNL (once in standin pt reported LLE numbness)       Communication   Communication: No difficulties  Cognition Arousal/Alertness: Awake/alert Behavior During Therapy: WFL for tasks assessed/performed Overall Cognitive Status: Within Functional Limits for tasks assessed                                          General Comments General comments (skin integrity, edema, etc.): pt is motivated to go home, however understanding of safety concerns associated with LLE instabiltiy    Exercises Total Joint Exercises Ankle Circles/Pumps: AROM, Both, 20 reps Quad Sets: AROM, Left, 5 reps Short Arc Quad: AAROM, Left, 5 reps Heel Slides: AROM, Left, 5 reps Hip ABduction/ADduction: AROM, Left, 5 reps Straight Leg Raises: AAROM, Left, 5 reps Long Arc Quad: AAROM, Left, Other reps (comment) (3)   Assessment/Plan    PT Assessment Patient needs continued PT services  PT Problem List Decreased strength;Decreased range of motion;Decreased activity tolerance;Decreased balance;Decreased coordination;Decreased mobility;Decreased knowledge of use of DME;Pain       PT Treatment Interventions DME instruction;Gait training;Stair training;Functional mobility training;Therapeutic activities;Therapeutic exercise;Balance training;Neuromuscular re-education;Patient/family education;Modalities    PT Goals (Current goals can be found in the Care Plan section)  Acute Rehab PT Goals Patient Stated Goal: to all of the things I could not do before including trips with the kids and walking on beach PT Goal Formulation: With patient Time For Goal Achievement:  06/05/22 Potential to Achieve Goals: Good    Frequency       Co-evaluation               AM-PAC PT "6 Clicks" Mobility  Outcome Measure Help needed turning from your back to your side while in a flat bed without using bedrails?: A Little Help needed moving from lying on your back to sitting on the side of a flat bed without using bedrails?: A Little Help needed moving to and from a bed to a chair (including a wheelchair)?: A Lot Help needed standing up from a chair using your arms (e.g., wheelchair or bedside chair)?: Total Help needed to walk in hospital room?: Total Help needed climbing 3-5 steps with a railing? : Total 6 Click Score: 11    End of Session Equipment Utilized During Treatment: Gait belt Activity Tolerance: Treatment limited secondary to medical complications (Comment) (LLE instability) Patient left: in bed;with call bell/phone within reach Nurse Communication: Mobility status;Other (comment) (not appropriate at this time for d/c) PT Visit Diagnosis: Unsteadiness on feet (R26.81);Other abnormalities of gait and mobility (R26.89);Muscle weakness (generalized) (M62.81);Difficulty in walking, not elsewhere classified (R26.2);Pain Pain -  Right/Left: Left Pain - part of body: Knee    Time: [id number] PT Time Calculation (min) (ACUTE ONLY): 31 min   Charges:   PT Evaluation $PT Eval Low Complexity: 1 Low PT Treatments $Therapeutic Activity: 8-22 mins       Baird Lyons, PT   Adair Patter 05/22/2022, 6:47 PM

## 2022-05-22 NOTE — Op Note (Signed)
DATE OF SURGERY:  05/22/2022 TIME: 3:00 PM  PATIENT NAME:  Latasha Shepard   AGE: 52 y.o.    PRE-OPERATIVE DIAGNOSIS: End-stage left knee osteoarthritis  POST-OPERATIVE DIAGNOSIS:  Same  PROCEDURE:  Left Total Knee Arthroplasty  SURGEON:  Austyn Perriello A Deeanna Beightol, MD   ASSISTANT: Dorise Bullion, PA-C, present and scrubbed throughout the case, critical for assistance with exposure, retraction, instrumentation, and closure.   OPERATIVE IMPLANTS:  Zimmer persona size 4 left standard femur, C tibial baseplate, 29 mm patella, 12 mm MC poly insert Implant Name Type Inv. Item Serial No. Manufacturer Lot No. LRB No. Used Action  CEMENT BONE R 1X40 - [id number] Cement CEMENT BONE R 1X40  ZIMMER [employer]ON(ORTH,TRAU,BIO,SG) [id number] Left 2 Implanted  COMP FEM CMT PS N 4 LT - [id number] Joint COMP FEM CMT PS N 4 LT  ZIMMER [employer]ON(ORTH,TRAU,BIO,SG) [id number] Left 1 Implanted  TIBIA STEM 5 DEG SZ C L KNEE - [id number] Knees TIBIA STEM 5 DEG SZ C L KNEE  ZIMMER RECON(ORTH,TRAU,BIO,SG) [id number] Left 1 Implanted  STEM POLY PAT PLY 87M KNEE - [id number] Knees STEM POLY PAT PLY 87M KNEE  ZIMMER RECON(ORTH,TRAU,BIO,SG) [id number] Left 1 Implanted  INSERT AS PERS 4-5 C-D 12 LT - [id number] Insert INSERT AS PERS 4-5 C-D 12 LT  ZIMMER RECON(ORTH,TRAU,BIO,SG) [id number] Left 1 Implanted      PREOPERATIVE INDICATIONS:  Latasha Shepard is a 52 y.o. year old female with end stage bone on bone degenerative arthritis of the knee who failed conservative treatment, including injections, antiinflammatories, activity modification, and assistive devices, and had significant impairment of their activities of daily living, and elected for Total Knee Arthroplasty.   The risks, benefits, and alternatives were discussed at length including but not limited to the risks of infection, bleeding, nerve injury, stiffness, blood clots, the need for revision surgery, cardiopulmonary complications, among others, and they were willing to  proceed.  ESTIMATED BLOOD LOSS: 50cc  OPERATIVE DESCRIPTION:   Once adequate anesthesia was induced, preoperative antibiotics, 2 gm of ancef,1 gm of Tranexamic Acid, and 8 mg of Decadron administered, the patient was positioned supine with a left thigh tourniquet placed.  The left lower extremity was prepped and draped in sterile fashion.  A time-  out was performed identifying the patient, planned procedure, and the appropriate extremity.     The leg was  exsanguinated, tourniquet elevated to 250 mmHg.  A midline incision was  made followed by median parapatellar arthrotomy. Anterior horn of the medial meniscus was released and resected. A medial release was performed, the infrapatellar fat pad was resected with care taken to protect the patellar tendon. The suprapatellar fat was removed to exposed the distal anterior femur. The anterior horn of the lateral meniscus and ACL were released.    Following initial  exposure, I first started with the femur  The femoral  canal was opened with a drill, canal was suctioned to try to prevent fat emboli.  An  intramedullary rod was passed set at 6 degrees valgus, 10 mm. The distal femur was resected.  Following this resection, the tibia was  subluxated anteriorly.  Using the extramedullary guide, 14mm of bone was resected off   the proximal lateral tibia.  We confirmed the gap would be  stable medially and laterally with a size 46mm spacer block as well as confirmed that the tibial cut was perpendicular in the coronal plane, checking with an alignment rod.    Once this was done, the posterior femoral referencing femoral sizer was placed under  to the posterior condyles with 3 degrees of external rotational which was parallel to the transepicondylar axis and perpendicular to [employer]. The femur was sized to be a size 4 in the anterior-  posterior dimension. The  anterior, posterior, and  chamfer cuts were made without difficulty nor   notching making  certain that I was along the anterior cortex to help  with flexion gap stability. Next a laminar spreader was placed with the knee in flexion and the medial lateral menisci were resected.  5 cc of the Exparel mixture was injected in the medial side of the back of the knee and 3 cc in the lateral side.  1/2 inch curved osteotome was used to resect posterior osteophyte that was then removed with a pituitary rongeur.       At this point, the tibia was sized to be a size C.  The size C tray was  then pinned in position. Trial reduction was now carried with a 4 femur, C tibia, a 27mm MC insert.  Laxity in both flexion and extension, improved with 80mm insert. The knee had full extension and was stable to varus valgus stress in extension.  The knee was stable in flexion and PCL was left intact.  Attention was next directed to the patella.  Precut  measurement was noted to be 19 mm.  I resected down to 12 mm and used a  20mm patellar button to restore patellar height as well as cover the cut surface.     The patella lug holes were drilled and a 101mm patella poly trial was placed.    The knee was brought to full extension with good flexion stability with the patella tracking through the trochlea without application of pressure.     Next the femoral component was again assessed and determined to be seated and appropriately lateralized.  The femoral lug holes were drilled.  The femoral component was then removed. Tibial component was again assessed and felt to be seated and appropriately rotated with the medial third of the tubercle. The tibia was then drilled, and keel punched.     Final components were  opened and cement was mixed.      Final implants were then  cemented onto cleaned and dried cut surfaces of bone with the knee brought to extension with a 1 mm MC poly.  The knee was irrigated with sterile Betadine diluted in saline as well as pulse lavage normal saline.  The synovial lining was  then  injected a dilute Exparel.      Once the cement had fully cured, excess cement was removed throughout the knee.  I confirmed that I was satisfied with the range of motion and stability, and the final 80mm MC poly insert was chosen.  It was placed into the knee.         The tourniquet had been let down at 75 minutes.  No significant hemostasis was required.  The medial parapatellar arthrotomy was then reapproximated using #1 Stratafix sutures with the knee  in flexion.  The remaining wound was closed with 0 stratafix, 2-0 Vicryl, and running 3-0 Monocryl. The knee was cleaned, dried, dressed sterilely using Dermabond and   Aquacel dressing.  The patient was then brought to recovery room in stable condition, tolerating the procedure  well. There were no complications.   Post op recs: WB: WBAT Abx: ancef Imaging: PACU xrays DVT prophylaxis: Aspirin 81mg  BID x4 weeks Follow up: 2 weeks after surgery for  a wound check with Dr. Zachery Dakins at [facility].  Address: [address], [place], [place] [address]  Office Phone: [phone]  Charlies Constable, MD Orthopaedic Surgery

## 2022-05-22 NOTE — Anesthesia Procedure Notes (Signed)
Anesthesia Regional Block: Adductor canal block   Pre-Anesthetic Checklist: , timeout performed,  Correct Patient, Correct Site, Correct Laterality,  Correct Procedure, Correct Position, site marked,  Risks and benefits discussed,  Surgical consent,  Pre-op evaluation,  At surgeon's request and post-op pain management  Laterality: Left  Prep: chloraprep       Needles:  Injection technique: Single-shot  Needle Type: Echogenic Needle     Needle Length: 9cm  Needle Gauge: 21     Additional Needles:   Procedures:,,,, ultrasound used (permanent image in chart),,    Narrative:  Start time: 05/22/2022 11:25 AM End time: 05/22/2022 11:34 AM Injection made incrementally with aspirations every 5 mL.  Performed by: Personally  Anesthesiologist: Santa Lighter, MD  Additional Notes: No pain on injection. No increased resistance to injection. Injection made in 5cc increments.  Good needle visualization.  Patient tolerated procedure well.

## 2022-05-22 NOTE — Progress Notes (Signed)
Physical Therapy Treatment Patient Details Name: Latasha Shepard MRN: [id number] DOB: [birth year] Today's Date: 05/22/2022   History of Present Illness 52 yo female presents to therapy s/p L TKA on 05/22/2022 due to failure of conservative measures. Pt PMH includes but is not limited to: HDL, HTN, herpes and OA.    PT Comments     Latasha Shepard is a 52 y.o. female POD 0 s/p L TKA. Patient reports IND with mobility at baseline. Patient is now limited by functional impairments (see PT problem list below). PT returned one hr later to re-assess pt for same day d/c and no apparent change with L quad motor control, coordination and strength pt continues to require min A for sit to supine for LLE and required mod A for STS from EOB with noted LLE instability and pt indicated she did not think she was ready to go home, unable to assess for SPT or gait at this time.  Pt unable to complete SLR without AA or LAQ seated EOB. Patient will benefit from continued skilled PT interventions to address impairments and progress towards PLOF. PT will continue to follow if pt remains for acute stay to progress towards Mod I goals.   Recommendations for follow up therapy are one component of a multi-disciplinary discharge planning process, led by the attending physician.  Recommendations may be updated based on patient status, additional functional criteria and insurance authorization.  Follow Up Recommendations  Outpatient PT     Assistance Recommended at Discharge Frequent or constant Supervision/Assistance  Patient can return home with the following A little help with walking and/or transfers;A little help with bathing/dressing/bathroom;Assistance with cooking/housework;Assist for transportation;Help with stairs or ramp for entrance   Equipment Recommendations  Rolling walker (2 wheels) (provided and adjusted)    Recommendations for Other Services       Precautions / Restrictions Precautions Precautions:  Fall;Knee Restrictions Weight Bearing Restrictions: No     Mobility  Bed Mobility Overal bed mobility: Needs Assistance Bed Mobility: Supine to Sit, Sit to Supine     Supine to sit: Min guard Sit to supine: Min assist (for LLE)   General bed mobility comments: pt requires cues, increased time and physical assist for L LE into bed    Transfers Overall transfer level: Needs assistance Equipment used: Rolling walker (2 wheels) Transfers: Sit to/from Stand Sit to Stand: Mod assist           General transfer comment: continued LLE instabiltiy noted    Ambulation/Gait               General Gait Details: NT due to LLE instability   Stairs             Wheelchair Mobility    Modified Rankin (Stroke Patients Only)       Balance                                            Cognition Arousal/Alertness: Awake/alert Behavior During Therapy: WFL for tasks assessed/performed Overall Cognitive Status: Within Functional Limits for tasks assessed                                          Exercises Total Joint Exercises Ankle Circles/Pumps: AROM, Both, 20 reps Quad Sets: AROM, Left, 5  reps Short Arc Quad: AAROM, Left, 5 reps Heel Slides: AROM, Left, 5 reps Hip ABduction/ADduction: AROM, Left, 5 reps Straight Leg Raises: AAROM, Left, 5 reps Long Arc Quad:  (3)    General Comments General comments (skin integrity, edema, etc.): pt is motivated to go home, however understanding of safety concerns associated with LLE instabiltiy      Pertinent Vitals/Pain Pain Assessment Pain Assessment: 0-10 Pain Score: 3  Pain Location: L knee Pain Descriptors / Indicators: Aching, Constant, Operative site guarding Pain Intervention(s): Limited activity within patient's tolerance, Monitored during session, Premedicated before session, Ice applied    Home Living Family/patient expects to be discharged to:: Private residence Living  Arrangements: Spouse/significant other;Children Available Help at Discharge: Family;Available 24 hours/day Type of Home: House Home Access: Stairs to enter Entrance Stairs-Rails: Right Entrance Stairs-Number of Steps: 3   Home Layout: Two level;Able to live on main level with bedroom/bathroom Home Equipment: BSC/3in1;Shower seat - built in (ice man machine)      Prior Function            PT Goals (current goals can now be found in the care plan section) Acute Rehab PT Goals Patient Stated Goal: to all of the things I could not do before including trips with the kids and walking on beach PT Goal Formulation: With patient Time For Goal Achievement: 06/05/22 Potential to Achieve Goals: Good    Frequency    7X/week      PT Plan      Co-evaluation              AM-PAC PT "6 Clicks" Mobility   Outcome Measure  Help needed turning from your back to your side while in a flat bed without using bedrails?: A Little Help needed moving from lying on your back to sitting on the side of a flat bed without using bedrails?: A Little Help needed moving to and from a bed to a chair (including a wheelchair)?: A Lot Help needed standing up from a chair using your arms (e.g., wheelchair or bedside chair)?: Total Help needed to walk in hospital room?: Total Help needed climbing 3-5 steps with a railing? : Total 6 Click Score: 11    End of Session Equipment Utilized During Treatment: Gait belt Activity Tolerance: Treatment limited secondary to medical complications (Comment) (LLE instability) Patient left: in bed;with call bell/phone within reach Nurse Communication: Mobility status;Other (comment) (not appropriate at this time for d/c and pt to transition to inpt) PT Visit Diagnosis: Unsteadiness on feet (R26.81);Other abnormalities of gait and mobility (R26.89);Muscle weakness (generalized) (M62.81);Difficulty in walking, not elsewhere classified (R26.2);Pain Pain - Right/Left:  Left Pain - part of body: Knee     Time: [id number] PT Time Calculation (min) (ACUTE ONLY): 11 min  Charges:  $Therapeutic Activity: 8-22 mins                     Baird Lyons, PT    Adair Patter 05/22/2022, 7:23 PM

## 2022-05-22 NOTE — Anesthesia Preprocedure Evaluation (Addendum)
Anesthesia Evaluation  Patient identified by MRN, date of birth, ID band Patient awake    Reviewed: Allergy & Precautions, NPO status , Patient's Chart, lab work & pertinent test results  Airway Mallampati: II  TM Distance: <3 FB Neck ROM: Full    Dental  (+) Teeth Intact, Dental Advisory Given   Pulmonary former smoker   Pulmonary exam normal breath sounds clear to auscultation       Cardiovascular hypertension, Pt. on medications Normal cardiovascular exam Rhythm:Regular Rate:Normal     Neuro/Psych  PSYCHIATRIC DISORDERS Anxiety     negative neurological ROS     GI/Hepatic Neg liver ROS,GERD  Medicated,,  Endo/Other  diabetes, Type 2, Oral Hypoglycemic Agents  Obesity   Renal/GU negative Renal ROS     Musculoskeletal  (+) Arthritis ,  OA LEFT KNEE   Abdominal   Peds  Hematology negative hematology ROS (+)   Anesthesia Other Findings Day of surgery medications reviewed with the patient.  Reproductive/Obstetrics                             Anesthesia Physical Anesthesia Plan  ASA: 2  Anesthesia Plan: Spinal   Post-op Pain Management: Tylenol PO (pre-op)*   Induction: Intravenous  PONV Risk Score and Plan: 2 and TIVA, Treatment may vary due to age or medical condition, Midazolam, Dexamethasone and Ondansetron  Airway Management Planned: Natural Airway and Simple Face Mask  Additional Equipment:   Intra-op Plan:   Post-operative Plan:   Informed Consent: I have reviewed the patients History and Physical, chart, labs and discussed the procedure including the risks, benefits and alternatives for the proposed anesthesia with the patient or authorized representative who has indicated his/her understanding and acceptance.     Dental advisory given  Plan Discussed with: CRNA, Anesthesiologist and Surgeon  Anesthesia Plan Comments: (Discussed risks and benefits of and  differences between spinal and general. Discussed risks of spinal including headache, backache, failure, bleeding, infection, and nerve damage. Patient consents to spinal. Questions answered. Coagulation studies and platelet count acceptable.)       Anesthesia Quick Evaluation

## 2022-05-22 NOTE — Progress Notes (Signed)
Orthopedic Tech Progress Note Patient Details:  Latasha Shepard 1970/10/20 [id number]  Ortho Devices Type of Ortho Device: Bone foam zero knee Ortho Device/Splint Interventions: Ordered   Post Interventions Instructions Provided: Care of device  [place] [facility] 05/22/2022, 4:31 PM

## 2022-05-22 NOTE — Transfer of Care (Signed)
Immediate Anesthesia Transfer of Care Note  Patient: Latasha Shepard  Procedure(s) Performed: TOTAL KNEE ARTHROPLASTY (Left: Knee)  Patient Location: PACU  Anesthesia Type:Spinal and MAC combined with regional for post-op pain  Level of Consciousness: awake and alert   Airway & Oxygen Therapy: Patient Spontanous Breathing and Patient connected to nasal cannula oxygen  Post-op Assessment: Report given to RN and Post -op Vital signs reviewed and stable  Post vital signs: Reviewed and stable  Last Vitals:  Vitals Value Taken Time  BP 106/69 05/22/22 1601  Temp    Pulse 91 05/22/22 1606  Resp 18 05/22/22 1606  SpO2 93 % 05/22/22 1606  Vitals shown include unvalidated device data.  Last Pain:  Vitals:   05/22/22 1133  TempSrc:   PainSc: 0-No pain         Complications: No notable events documented.

## 2022-05-22 NOTE — Anesthesia Procedure Notes (Signed)
Spinal  Patient location during procedure: OR Start time: 05/22/2022 1:04 PM End time: 05/22/2022 1:07 PM Reason for block: surgical anesthesia Staffing Performed: anesthesiologist  Anesthesiologist: Santa Lighter, MD Performed by: Santa Lighter, MD Authorized by: Santa Lighter, MD   Preanesthetic Checklist Completed: patient identified, IV checked, risks and benefits discussed, surgical consent, monitors and equipment checked, pre-op evaluation and timeout performed Spinal Block Patient position: sitting Prep: DuraPrep and site prepped and draped Patient monitoring: continuous pulse ox and blood pressure Approach: midline Location: L3-4 Injection technique: single-shot Needle Needle type: Pencan  Needle gauge: 24 G Assessment Events: CSF return Additional Notes Functioning IV was confirmed and monitors were applied. Sterile prep and drape, including hand hygiene, mask and sterile gloves were used. The patient was positioned and the spine was prepped. The skin was anesthetized with lidocaine.  Free flow of clear CSF was obtained prior to injecting local anesthetic into the CSF.  The spinal needle aspirated freely following injection.  The needle was carefully withdrawn.  The patient tolerated the procedure well. Consent was obtained prior to procedure with all questions answered and concerns addressed. Risks including but not limited to bleeding, infection, nerve damage, paralysis, failed block, inadequate analgesia, allergic reaction, high spinal, itching and headache were discussed and the patient wished to proceed.   Hoy Morn, MD

## 2022-05-22 NOTE — Discharge Instructions (Signed)
INSTRUCTIONS AFTER JOINT REPLACEMENT  ° °Remove items at home which could result in a fall. This includes throw rugs or furniture in walking pathways °ICE to the affected joint every three hours while awake for 30 minutes at a time, for at least the first 3-5 days, and then as needed for pain and swelling.  Continue to use ice for pain and swelling. You may notice swelling that will progress down to the foot and ankle.  This is normal after surgery.  Elevate your leg when you are not up walking on it.   °Continue to use the breathing machine you got in the hospital (incentive spirometer) which will help keep your temperature down.  It is common for your temperature to cycle up and down following surgery, especially at night when you are not up moving around and exerting yourself.  The breathing machine keeps your lungs expanded and your temperature down. ° ° °DIET:  As you were doing prior to hospitalization, we recommend a well-balanced diet. ° °DRESSING / WOUND CARE / SHOWERING ° °Keep the surgical dressing until follow up.  The dressing is water proof, so you can shower without any extra covering.  IF THE DRESSING FALLS OFF or the wound gets wet inside, change the dressing with sterile gauze.  Please use good hand washing techniques before changing the dressing.  Do not use any lotions or creams on the incision until instructed by your surgeon.   ° °ACTIVITY ° °Increase activity slowly as tolerated, but follow the weight bearing instructions below.   °No driving for 6 weeks or until further direction given by your physician.  You cannot drive while taking narcotics.  °No lifting or carrying greater than 10 lbs. until further directed by your surgeon. °Avoid periods of inactivity such as sitting longer than an hour when not asleep. This helps prevent blood clots.  °You may return to work once you are authorized by your doctor.  ° ° ° °WEIGHT BEARING  ° °Weight bearing as tolerated with assist device (walker, cane,  etc) as directed, use it as long as suggested by your surgeon or therapist, typically at least 4-6 weeks. ° ° °EXERCISES ° °Results after joint replacement surgery are often greatly improved when you follow the exercise, range of motion and muscle strengthening exercises prescribed by your doctor. Safety measures are also important to protect the joint from further injury. Any time any of these exercises cause you to have increased pain or swelling, decrease what you are doing until you are comfortable again and then slowly increase them. If you have problems or questions, call your caregiver or physical therapist for advice.  ° °Rehabilitation is important following a joint replacement. After just a few days of immobilization, the muscles of the leg can become weakened and shrink (atrophy).  These exercises are designed to build up the tone and strength of the thigh and leg muscles and to improve motion. Often times heat used for twenty to thirty minutes before working out will loosen up your tissues and help with improving the range of motion but do not use heat for the first two weeks following surgery (sometimes heat can increase post-operative swelling).  ° °These exercises can be done on a training (exercise) mat, on the floor, on a table or on a bed. Use whatever works the best and is most comfortable for you.    Use music or television while you are exercising so that the exercises are a pleasant break in your   day. This will make your life better with the exercises acting as a break in your routine that you can look forward to.   Perform all exercises about fifteen times, three times per day or as directed.  You should exercise both the operative leg and the other leg as well. ° °Exercises include: °  °Quad Sets - Tighten up the muscle on the front of the thigh (Quad) and hold for 5-10 seconds.   °Straight Leg Raises - With your knee straight (if you were given a brace, keep it on), lift the leg to 60  degrees, hold for 3 seconds, and slowly lower the leg.  Perform this exercise against resistance later as your leg gets stronger.  °Leg Slides: Lying on your back, slowly slide your foot toward your buttocks, bending your knee up off the floor (only go as far as is comfortable). Then slowly slide your foot back down until your leg is flat on the floor again.  °Angel Wings: Lying on your back spread your legs to the side as far apart as you can without causing discomfort.  °Hamstring Strength:  Lying on your back, push your heel against the floor with your leg straight by tightening up the muscles of your buttocks.  Repeat, but this time bend your knee to a comfortable angle, and push your heel against the floor.  You may put a pillow under the heel to make it more comfortable if necessary.  ° °A rehabilitation program following joint replacement surgery can speed recovery and prevent re-injury in the future due to weakened muscles. Contact your doctor or a physical therapist for more information on knee rehabilitation.  ° ° °CONSTIPATION ° °Constipation is defined medically as fewer than three stools per week and severe constipation as less than one stool per week.  Even if you have a regular bowel pattern at home, your normal regimen is likely to be disrupted due to multiple reasons following surgery.  Combination of anesthesia, postoperative narcotics, change in appetite and fluid intake all can affect your bowels.  ° °YOU MUST use at least one of the following options; they are listed in order of increasing strength to get the job done.  They are all available over the counter, and you may need to use some, POSSIBLY even all of these options:   ° °Drink plenty of fluids (prune juice may be helpful) and high fiber foods °Colace 100 mg by mouth twice a day  °Senokot for constipation as directed and as needed Dulcolax (bisacodyl), take with full glass of water  °Miralax (polyethylene glycol) once or twice a day as  needed. ° °If you have tried all these things and are unable to have a bowel movement in the first 3-4 days after surgery call either your surgeon or your primary doctor.   ° °If you experience loose stools or diarrhea, hold the medications until you stool forms back up.  If your symptoms do not get better within 1 week or if they get worse, check with your doctor.  If you experience "the worst abdominal pain ever" or develop nausea or vomiting, please contact the office immediately for further recommendations for treatment. ° ° °ITCHING:  If you experience itching with your medications, try taking only a single pain pill, or even half a pain pill at a time.  You can also use Benadryl over the counter for itching or also to help with sleep.  ° °TED HOSE STOCKINGS:  Use stockings on both   legs until for at least 2 weeks or as directed by physician office. They may be removed at night for sleeping. ° °MEDICATIONS:  See your medication summary on the “After Visit Summary” that nursing will review with you.  You may have some home medications which will be placed on hold until you complete the course of blood thinner medication.  It is important for you to complete the blood thinner medication as prescribed. ° ° °Blood clot prevention (DVT Prophylaxis): After surgery you are at an increased risk for a blood clot. you were prescribed a blood thinner, Aspirin 81mg, to be taken twice daily for a total of 4 weeks from surgery to help reduce your risk of getting a blood clot. This will help prevent a blood clot. Signs of a pulmonary embolus (blood clot in the lungs) include sudden short of breath, feeling lightheaded or dizzy, chest pain with a deep breath, rapid pulse rapid breathing. Signs of a blood clot in your arms or legs include new unexplained swelling and cramping, warm, red or darkened skin around the painful area. Please call the office or 911 right away if these signs or symptoms develop. ° °PRECAUTIONS:  If you  experience chest pain or shortness of breath - call 911 immediately for transfer to the hospital emergency department.  ° °If you develop a fever greater that 101 F, purulent drainage from wound, increased redness or drainage from wound, foul odor from the wound/dressing, or calf pain - CONTACT YOUR SURGEON.   °                                                °FOLLOW-UP APPOINTMENTS:  If you do not already have a post-op appointment, please call the office for an appointment to be seen by your surgeon.  Guidelines for how soon to be seen are listed in your “After Visit Summary”, but are typically between 2-3 weeks after surgery. ° °OTHER INSTRUCTIONS:  ° °Knee Replacement:  Do not place pillow under knee, focus on keeping the knee straight while resting. CPM instructions: 0-90 degrees, 2 hours in the morning, 2 hours in the afternoon, and 2 hours in the evening. Place foam block, curve side up under heel at all times except when in CPM or when walking.  DO NOT modify, tear, cut, or change the foam block in any way. ° °POST-OPERATIVE OPIOID TAPER INSTRUCTIONS: °It is important to wean off of your opioid medication as soon as possible. If you do not need pain medication after your surgery it is ok to stop day one. °Opioids include: °Codeine, Hydrocodone(Norco, Vicodin), Oxycodone(Percocet, oxycontin) and hydromorphone amongst others.  °Long term and even short term use of opiods can cause: °Increased pain response °Dependence °Constipation °Depression °Respiratory depression °And more.  °Withdrawal symptoms can include °Flu like symptoms °Nausea, vomiting °And more °Techniques to manage these symptoms °Hydrate well °Eat regular healthy meals °Stay active °Use relaxation techniques(deep breathing, meditating, yoga) °Do Not substitute Alcohol to help with tapering °If you have been on opioids for less than two weeks and do not have pain than it is ok to stop all together.  °Plan to wean off of opioids °This plan should  start within one week post op of your joint replacement. °Maintain the same interval or time between taking each dose and first decrease the dose.  °Cut the   total daily intake of opioids by one tablet each day °Next start to increase the time between doses. °The last dose that should be eliminated is the evening dose.  ° °MAKE SURE YOU:  °Understand these instructions.  °Get help right away if you are not doing well or get worse.  ° ° °Thank you for letting us be a part of your medical care team.  It is a privilege we respect greatly.  We hope these instructions will help you stay on track for a fast and full recovery!  ° ° ° °  °  °

## 2022-05-23 ENCOUNTER — Encounter (HOSPITAL_COMMUNITY): Payer: Self-pay | Admitting: Orthopedic Surgery

## 2022-05-23 ENCOUNTER — Other Ambulatory Visit: Payer: Self-pay

## 2022-05-23 DIAGNOSIS — M1712 Unilateral primary osteoarthritis, left knee: Secondary | ICD-10-CM | POA: Diagnosis not present

## 2022-05-23 LAB — CBC
HCT: 33.3 % — ABNORMAL LOW (ref 36.0–46.0)
Hemoglobin: 11.1 g/dL — ABNORMAL LOW (ref 12.0–15.0)
MCH: 29.8 pg (ref 26.0–34.0)
MCHC: 33.3 g/dL (ref 30.0–36.0)
MCV: 89.3 fL (ref 80.0–100.0)
Platelets: 263 10*3/uL (ref 150–400)
RBC: 3.73 MIL/uL — ABNORMAL LOW (ref 3.87–5.11)
RDW: 12.6 % (ref 11.5–15.5)
WBC: 15.1 10*3/uL — ABNORMAL HIGH (ref 4.0–10.5)
nRBC: 0 % (ref 0.0–0.2)

## 2022-05-23 LAB — BASIC METABOLIC PANEL
Anion gap: 11 (ref 5–15)
BUN: 13 mg/dL (ref 6–20)
CO2: 25 mmol/L (ref 22–32)
Calcium: 9 mg/dL (ref 8.9–10.3)
Chloride: 101 mmol/L (ref 98–111)
Creatinine, Ser: 0.57 mg/dL (ref 0.44–1.00)
GFR, Estimated: >60 mL/min (ref >60)
Glucose, Bld: 180 mg/dL — ABNORMAL HIGH (ref 70–99)
Potassium: 4.2 mmol/L (ref 3.5–5.1)
Sodium: 137 mmol/L (ref 135–145)

## 2022-05-23 LAB — GLUCOSE, CAPILLARY
Glucose-Capillary: 168 mg/dL — ABNORMAL HIGH (ref 70–99)
Glucose-Capillary: 123 mg/dL — ABNORMAL HIGH (ref 70–99)

## 2022-05-23 MED ORDER — INSULIN ASPART 100 UNIT/ML IJ SOLN
0.0000 [IU] | Freq: Three times a day (TID) | INTRAMUSCULAR | Status: DC
  Administered 2022-05-23: 2 [IU] via SUBCUTANEOUS
  Administered 2022-05-23: 3 [IU] via SUBCUTANEOUS

## 2022-05-23 MED ORDER — INSULIN ASPART 100 UNIT/ML IJ SOLN
4.0000 [IU] | Freq: Three times a day (TID) | INTRAMUSCULAR | Status: DC
  Administered 2022-05-23 (×2): 4 [IU] via SUBCUTANEOUS

## 2022-05-23 MED ORDER — INSULIN ASPART 100 UNIT/ML IJ SOLN: 0.0000 [IU] | Freq: Every day | INTRAMUSCULAR | Status: DC

## 2022-05-23 NOTE — Progress Notes (Signed)
Physical Therapy Treatment Patient Details Name: Latasha Shepard MRN: [id number] DOB: 1970/08/22 Today's Date: 05/23/2022   History of Present Illness 52 yo female presents to therapy s/p L TKA on 05/22/2022 due to failure of conservative measures. Pt PMH includes but is not limited to: HDL, HTN, herpes and OA.    PT Comments    POD # 1 pm session Assisted OOB to amb in hallway, practice stairs then complete HEP Education went well. Pt ready for D/C to home.  Recommendations for follow up therapy are one component of a multi-disciplinary discharge planning process, led by the attending physician.  Recommendations may be updated based on patient status, additional functional criteria and insurance authorization.  Follow Up Recommendations  Outpatient PT     Assistance Recommended at Discharge Frequent or constant Supervision/Assistance  Patient can return home with the following A little help with walking and/or transfers;A little help with bathing/dressing/bathroom;Assistance with cooking/housework;Assist for transportation;Help with stairs or ramp for entrance   Equipment Recommendations  Rolling walker (2 wheels)    Recommendations for Other Services       Precautions / Restrictions Precautions Precautions: Fall;Knee Precaution Comments: instructed no pillow under knee Restrictions Weight Bearing Restrictions: No     Mobility  Bed Mobility Overal bed mobility: Needs Assistance Bed Mobility: Supine to Sit     Supine to sit: Supervision     General bed mobility comments: demonstarted and educaed on use of belt to self assist LE    Transfers Overall transfer level: Needs assistance Equipment used: Rolling walker (2 wheels) Transfers: Sit to/from Stand Sit to Stand: Supervision           General transfer comment: 25% VC's on proper hand placement as well as safety with turns.    Ambulation/Gait Ambulation/Gait assistance: Supervision Gait Distance (Feet):  75 Feet Assistive device: Rolling walker (2 wheels) Gait Pattern/deviations: Step-to pattern, Decreased stance time - left Gait velocity: decreased     General Gait Details: 25% VC's on proper sequencing and walker to self distance.  "The knee is starting to wake up" stated pt.  Slightly increased pain and "more feeling".  Tolerated a functional distance of 75 feet.   Stairs Stairs: Yes Stairs assistance: Supervision, Min guard Stair Management: One rail Right, Step to pattern, Forwards, Sideways Number of Stairs: 2 General stair comments: 25% VC's on proper sequencing and instructions to "lock" L knee for increased stability.  Performed well.   Wheelchair Mobility    Modified Rankin (Stroke Patients Only)       Balance                                            Cognition Arousal/Alertness: Awake/alert Behavior During Therapy: WFL for tasks assessed/performed Overall Cognitive Status: Within Functional Limits for tasks assessed                                 General Comments: AxO x 3 very pleasant and motivated        Exercises      General Comments        Pertinent Vitals/Pain Pain Assessment Pain Assessment: 0-10 Pain Score: 3  Pain Location: L knee Pain Descriptors / Indicators: Aching, Constant, Operative site guarding Pain Intervention(s): Monitored during session, Premedicated before session, Repositioned, Ice applied    Home  Living                          Prior Function            PT Goals (current goals can now be found in the care plan section) Progress towards PT goals: Progressing toward goals    Frequency    7X/week      PT Plan Current plan remains appropriate    Co-evaluation              AM-PAC PT "6 Clicks" Mobility   Outcome Measure  Help needed turning from your back to your side while in a flat bed without using bedrails?: A Little Help needed moving from lying on your  back to sitting on the side of a flat bed without using bedrails?: A Little Help needed moving to and from a bed to a chair (including a wheelchair)?: A Little Help needed standing up from a chair using your arms (e.g., wheelchair or bedside chair)?: A Little Help needed to walk in hospital room?: A Little Help needed climbing 3-5 steps with a railing? : A Little 6 Click Score: 18    End of Session Equipment Utilized During Treatment: Gait belt Activity Tolerance: Patient tolerated treatment well Patient left: in chair;with call bell/phone within reach;with family/visitor present Nurse Communication: Mobility status PT Visit Diagnosis: Unsteadiness on feet (R26.81);Other abnormalities of gait and mobility (R26.89);Muscle weakness (generalized) (M62.81);Difficulty in walking, not elsewhere classified (R26.2);Pain Pain - Right/Left: Left Pain - part of body: Knee     Time: 1400-1430 PT Time Calculation (min) (ACUTE ONLY): 30 min  Charges:  $Gait Training: 8-22 mins $Therapeutic Exercise: 8-22 mins                     Rica Koyanagi  PTA Acute  Rehabilitation Services Office M-F          [phone] Weekend pager [phone]

## 2022-05-23 NOTE — Progress Notes (Signed)
Physical Therapy Treatment Patient Details Name: Latasha Shepard MRN: [id number] DOB: [birth year] Today's Date: 05/23/2022   History of Present Illness 52 yo female presents to therapy s/p L TKA on 05/22/2022 due to failure of conservative measures. Pt PMH includes but is not limited to: HDL, HTN, herpes and OA.    PT Comments    POD # 1 am session General Comments: AxO x 3 very pleasant and motivated.  Assisted OOB to amb in hallway then perform a few TKR TE's went well.  General bed mobility comments: demonstarted and educaed on use of belt to self assist LE.  General transfer comment: 25% VC's on proper hand placement as well as safety with turns.  General Gait Details: 50% VC's on proper sequencing, walker to self distance and safety with turns.  Pt c/o her knee "still feels numb".  Instructed on step to gait pattern as well as to "lock" her knee during stance all while using B UE's support through walker.  Pt tolerated well amb 55 feet.  Spouse present and observered. Then returned to room to perform some TE's following HEP handout.  Instructed on proper tech, freq as well as use of ICE.   Pt will need another PT this afternoon to complete HEP TE's as well as practice stairs.    Recommendations for follow up therapy are one component of a multi-disciplinary discharge planning process, led by the attending physician.  Recommendations may be updated based on patient status, additional functional criteria and insurance authorization.  Follow Up Recommendations  Outpatient PT     Assistance Recommended at Discharge Frequent or constant Supervision/Assistance  Patient can return home with the following A little help with walking and/or transfers;A little help with bathing/dressing/bathroom;Assistance with cooking/housework;Assist for transportation;Help with stairs or ramp for entrance   Equipment Recommendations  Rolling walker (2 wheels)    Recommendations for Other Services        Precautions / Restrictions Precautions Precautions: Fall;Knee Precaution Comments: instructed no pillow under knee Restrictions Weight Bearing Restrictions: No     Mobility  Bed Mobility Overal bed mobility: Needs Assistance Bed Mobility: Supine to Sit     Supine to sit: Supervision     General bed mobility comments: demonstarted and educaed on use of belt to self assist LE    Transfers Overall transfer level: Needs assistance Equipment used: Rolling walker (2 wheels) Transfers: Sit to/from Stand Sit to Stand: Supervision, Min guard           General transfer comment: 25% VC's on proper hand placement as well as safety with turns.    Ambulation/Gait Ambulation/Gait assistance: Supervision, Min guard Gait Distance (Feet): 55 Feet Assistive device: Rolling walker (2 wheels) Gait Pattern/deviations: Step-to pattern, Decreased stance time - left Gait velocity: decreased     General Gait Details: 50% VC's on proper sequencing, walker to self distance and safety with turns.  Pt c/o her knee "still feels numb".  Instructed on step to gait pattern as well as to "lock" her knee during stance all while using B UE's support through walker.  Pt tolerated well amb 55 feet.  Spouse present and observered.   Stairs             Wheelchair Mobility    Modified Rankin (Stroke Patients Only)       Balance  Cognition Arousal/Alertness: Awake/alert Behavior During Therapy: WFL for tasks assessed/performed Overall Cognitive Status: Within Functional Limits for tasks assessed                                 General Comments: AxO x 3 very pleasant and motivated        Exercises  Total Knee Replacement TE's following HEP handout 10 reps B LE ankle pumps 05 reps towel squeezes 05 reps knee presses 05 reps heel slides   Educated on use of gait belt to assist with TE's Followed by ICE      General Comments        Pertinent Vitals/Pain Pain Assessment Pain Assessment: 0-10 Pain Score: 3  Pain Location: L knee Pain Descriptors / Indicators: Aching, Constant, Operative site guarding Pain Intervention(s): Monitored during session, Premedicated before session, Repositioned, Ice applied    Home Living                          Prior Function            PT Goals (current goals can now be found in the care plan section) Progress towards PT goals: Progressing toward goals    Frequency    7X/week      PT Plan Current plan remains appropriate    Co-evaluation              AM-PAC PT "6 Clicks" Mobility   Outcome Measure  Help needed turning from your back to your side while in a flat bed without using bedrails?: A Little Help needed moving from lying on your back to sitting on the side of a flat bed without using bedrails?: A Little Help needed moving to and from a bed to a chair (including a wheelchair)?: A Little Help needed standing up from a chair using your arms (e.g., wheelchair or bedside chair)?: A Little Help needed to walk in hospital room?: A Little Help needed climbing 3-5 steps with a railing? : A Little 6 Click Score: 18    End of Session Equipment Utilized During Treatment: Gait belt Activity Tolerance: Patient tolerated treatment well Patient left: in chair;with call bell/phone within reach;with family/visitor present Nurse Communication: Mobility status PT Visit Diagnosis: Unsteadiness on feet (R26.81);Other abnormalities of gait and mobility (R26.89);Muscle weakness (generalized) (M62.81);Difficulty in walking, not elsewhere classified (R26.2);Pain Pain - Right/Left: Left Pain - part of body: Knee     Time: 1100-1125 PT Time Calculation (min) (ACUTE ONLY): 25 min  Charges:  $Gait Training: 8-22 mins $Therapeutic Exercise: 8-22 mins                     Rica Koyanagi  PTA Acute  Rehabilitation Services Office M-F           [phone] Weekend pager [phone]

## 2022-05-23 NOTE — Progress Notes (Signed)
Pt received to room 1326 via stretcher and transferred to bed. Alert and oriented, husband with her. Call bell education initiated for needs/safety

## 2022-05-23 NOTE — TOC Transition Note (Signed)
Transition of Care [facility]) - CM/SW Discharge Note   Patient Details  Name: Latasha Shepard MRN: [id number] Date of Birth: September 06, 1970  Transition of Care (TOC) CM/SW Contact:  Lennart Pall, LCSW Phone Number: 05/23/2022, 10:02 AM   Clinical Narrative:     Met with pt and confirming she has received RW to room via [facility].  OPPT already arranged per pt.  No further TOC needs.  Final next level of care: OP Rehab Barriers to Discharge: No Barriers Identified   Patient Goals and CMS Choice      Discharge Placement                         Discharge Plan and Services Additional resources added to the After Visit Summary for                  DME Arranged: Walker rolling DME Agency: [facility]                  Social Determinants of Health (SDOH) Interventions SDOH Screenings   Food Insecurity: No Food Insecurity (05/23/2022)  Housing: Low Risk  (05/23/2022)  Transportation Needs: No Transportation Needs (05/23/2022)  Utilities: Not At Risk (05/23/2022)  Tobacco Use: Medium Risk (05/22/2022)     Readmission Risk Interventions     No data to display

## 2022-05-23 NOTE — Progress Notes (Signed)
     Subjective: Patient reports pain as well controlled. Unable to clear PT because of quad weakness yesterday. Has a good straight leg raise this morning, and got up to bedside commode overnight. Denies n/t in the foot, but says front of the knee feels numb which is normal related to intra-op block. Glucose a little high this morning at 180 so sliding scale ordered.   Objective:   VITALS:   Vitals:   05/22/22 2135 05/22/22 2318 05/23/22 0145 05/23/22 0526  BP: 116/79 107/66 113/74 117/72  Pulse: 70 63 66 62  Resp: 15 18 18 19   Temp: (!) 97.4 F (36.3 C) 97.8 F (36.6 C) 97.8 F (36.6 C) 97.6 F (36.4 C)  TempSrc: Oral Oral Oral Oral  SpO2: 94% 92% 96% 98%  Weight:   95.7 kg   Height:   5\' 4"  (1.626 m)     Sensation intact distally Intact pulses distally Dorsiflexion/Plantar flexion intact Incision: dressing C/D/I Compartment soft   Lab Results  Component Value Date   WBC 15.1 (H) 05/23/2022   HGB 11.1 (L) 05/23/2022   HCT 33.3 (L) 05/23/2022   MCV 89.3 05/23/2022   PLT 263 05/23/2022   BMET    Component Value Date/Time   NA 137 05/23/2022 0341   K 4.2 05/23/2022 0341   CL 101 05/23/2022 0341   CO2 25 05/23/2022 0341   GLUCOSE 180 (H) 05/23/2022 0341   BUN 13 05/23/2022 0341   CREATININE 0.57 05/23/2022 0341   CALCIUM 9.0 05/23/2022 0341   GFRNONAA >60 05/23/2022 0341      Xray: TKA components in good position no adverse features  Assessment/Plan: 1 Day Post-Op   Principal Problem:   S/P total knee arthroplasty, left  S/p L TKA 05/22/22  Post op recs: WB: WBAT Abx: ancef Imaging: PACU xrays DVT prophylaxis: Aspirin 81mg  BID x4 weeks Follow up: 2 weeks after surgery for a wound check with Dr. Zachery Dakins at [facility].  Address: [address] [place], [place], [place] [address]  Office Phone: [phone]   Charlies Constable, MD Orthopaedic Surgery        Cathryne Mancebo A [place] 05/23/2022, 7:03 AM   Charlies Constable,  MD  Contact information:   [phone] 7am-5pm epic message Dr. Zachery Dakins, or call office for patient follow up: [phone] After hours and holidays please check [email or web address] for group call information for [facility]

## 2022-05-23 NOTE — Discharge Summary (Signed)
Physician Discharge Summary  Patient ID: Barbarita Coontz MRN: [id number] DOB/AGE: Jan 31, 1971 52 y.o.  Admit date: 05/22/2022 Discharge date: 05/23/2022  Admission Diagnoses:  S/P total knee arthroplasty, left  Discharge Diagnoses:  Principal Problem:   S/P total knee arthroplasty, left   Past Medical History:  Diagnosis Date   Anxiety    Arthritis    Complication of anesthesia    Patient is a red-head and it takes more anesthesia. She has woke up during surgery. Spinal didn't take well in the past.   Diabetes mellitus without complication ([place])    GERD (gastroesophageal reflux disease)    Herpes    Hypercholesteremia    Hypertension     Surgeries: Procedure(s): TOTAL KNEE ARTHROPLASTY on 05/22/2022   Consultants (if any):   Discharged Condition: Improved  Hospital Course: Enrika Kattner is an 52 y.o. female who was admitted 05/22/2022 with a diagnosis of S/P total knee arthroplasty, left and went to the operating room on 05/22/2022 and underwent the above named procedures.    She was given perioperative antibiotics:  Anti-infectives (From admission, onward)    Start     Dose/Rate Route Frequency Ordered Stop   05/22/22 1900  ceFAZolin (ANCEF) IVPB 2g/100 mL premix        2 g 200 mL/hr over 30 Minutes Intravenous Every 6 hours 05/22/22 1559 05/23/22 0659   05/22/22 1000  ceFAZolin (ANCEF) IVPB 2g/100 mL premix        2 g 200 mL/hr over 30 Minutes Intravenous On call to O.R. 05/22/22 [id number] 05/22/22 1327     .  She was given sequential compression devices, early ambulation, and aspirin for DVT prophylaxis.  She benefited maximally from the hospital stay and there were no complications.    Recent vital signs:  Vitals:   05/23/22 0145 05/23/22 0526  BP: 113/74 117/72  Pulse: 66 62  Resp: 18 19  Temp: 97.8 F (36.6 C) 97.6 F (36.4 C)  SpO2: 96% 98%    Recent laboratory studies:  Lab Results  Component Value Date   HGB 11.1 (L) 05/23/2022   HGB 12.1  05/10/2022   HGB 12.1 03/29/2011   Lab Results  Component Value Date   WBC 15.1 (H) 05/23/2022   PLT 263 05/23/2022   Lab Results  Component Value Date   INR 1.06 11/02/2010   Lab Results  Component Value Date   NA 137 05/23/2022   K 4.2 05/23/2022   CL 101 05/23/2022   CO2 25 05/23/2022   BUN 13 05/23/2022   CREATININE 0.57 05/23/2022   GLUCOSE 180 (H) 05/23/2022    Discharge Medications:   Allergies as of 05/23/2022       Reactions   Aspirin Hives   Nsaids Hives        Medication List     STOP taking these medications    acetaminophen 650 MG CR tablet Commonly known as: TYLENOL Replaced by: acetaminophen 500 MG tablet   traMADol 50 MG tablet Commonly known as: ULTRAM       TAKE these medications    acetaminophen 500 MG tablet Commonly known as: TYLENOL Take 2 tablets (1,000 mg total) by mouth every 8 (eight) hours as needed. Replaces: acetaminophen 650 MG CR tablet   amLODipine 10 MG tablet Commonly known as: NORVASC Take 10 mg by mouth at bedtime.   aspirin EC 81 MG tablet Take 1 tablet (81 mg total) by mouth 2 (two) times daily for 28 days. Swallow whole.   atorvastatin 80  MG tablet Commonly known as: LIPITOR Take 80 mg by mouth at bedtime.   citalopram 10 MG tablet Commonly known as: CELEXA Take 10 mg by mouth at bedtime.   iVIZIA Dry Eyes 0.5 % Soln Generic drug: Povidone (PF) Place 1 drop into both eyes 2 (two) times daily.   lisinopril 10 MG tablet Commonly known as: ZESTRIL Take 10 mg by mouth at bedtime.   metFORMIN 500 MG tablet Commonly known as: GLUCOPHAGE Take 500 mg by mouth 2 (two) times daily.   methocarbamol 500 MG tablet Commonly known as: ROBAXIN Take 1 tablet (500 mg total) by mouth every 8 (eight) hours as needed for up to 10 days for muscle spasms.   omeprazole 20 MG capsule Commonly known as: PRILOSEC Take 20 mg by mouth at bedtime.   ondansetron 4 MG tablet Commonly known as: Zofran Take 1 tablet (4 mg  total) by mouth every 8 (eight) hours as needed for up to 14 days for nausea or vomiting.   oxyCODONE 5 MG immediate release tablet Commonly known as: Roxicodone Take 1 tablet (5 mg total) by mouth every 4 (four) hours as needed for up to 7 days for severe pain or moderate pain.   valACYclovir 1000 MG tablet Commonly known as: VALTREX Take 500 mg by mouth daily as needed (Cold sore).        Diagnostic Studies: DG Knee Left Port  Result Date: 05/22/2022 CLINICAL DATA:  Postop. EXAM: PORTABLE LEFT KNEE - 1-2 VIEW COMPARISON:  None Available. FINDINGS: Left knee arthroplasty in expected alignment. No periprosthetic lucency or fracture. There has been patellar resurfacing. Recent postsurgical change includes air and edema in the soft tissues and joint space. IMPRESSION: Left knee arthroplasty without immediate postoperative complication. Electronically Signed   By: Keith Rake M.D.   On: 05/22/2022 16:34    Disposition: Discharge disposition: 01-Home or Self Care       Discharge Instructions     Call MD / Call 911   Complete by: As directed    If you experience chest pain or shortness of breath, CALL 911 and be transported to the hospital emergency room.  If you develope a fever above 101 F, pus (white drainage) or increased drainage or redness at the wound, or calf pain, call your surgeon's office.   Constipation Prevention   Complete by: As directed    Drink plenty of fluids.  Prune juice may be helpful.  You may use a stool softener, such as Colace (over the counter) 100 mg twice a day.  Use MiraLax (over the counter) for constipation as needed.   Diet - low sodium heart healthy   Complete by: As directed    Do not put a pillow under the knee. Place it under the heel.   Complete by: As directed    Increase activity slowly as tolerated   Complete by: As directed    Post-operative opioid taper instructions:   Complete by: As directed    POST-OPERATIVE OPIOID TAPER  INSTRUCTIONS: It is important to wean off of your opioid medication as soon as possible. If you do not need pain medication after your surgery it is ok to stop day one. Opioids include: Codeine, Hydrocodone(Norco, Vicodin), Oxycodone(Percocet, oxycontin) and hydromorphone amongst others.  Long term and even short term use of opiods can cause: Increased pain response Dependence Constipation Depression Respiratory depression And more.  Withdrawal symptoms can include Flu like symptoms Nausea, vomiting And more Techniques to manage these symptoms Hydrate well  Eat regular healthy meals Stay active Use relaxation techniques(deep breathing, meditating, yoga) Do Not substitute Alcohol to help with tapering If you have been on opioids for less than two weeks and do not have pain than it is ok to stop all together.  Plan to wean off of opioids This plan should start within one week post op of your joint replacement. Maintain the same interval or time between taking each dose and first decrease the dose.  Cut the total daily intake of opioids by one tablet each day Next start to increase the time between doses. The last dose that should be eliminated is the evening dose.           Follow-up Information     Willaim Sheng, MD. Go in 2 week(s).   Specialty: Orthopedic Surgery Contact information: [place] [address] [phone]                    Discharge Instructions      INSTRUCTIONS AFTER JOINT REPLACEMENT   Remove items at home which could result in a fall. This includes throw rugs or furniture in walking pathways ICE to the affected joint every three hours while awake for 30 minutes at a time, for at least the first 3-5 days, and then as needed for pain and swelling.  Continue to use ice for pain and swelling. You may notice swelling that will progress down to the foot and ankle.  This is normal after surgery.  Elevate your leg  when you are not up walking on it.   Continue to use the breathing machine you got in the hospital (incentive spirometer) which will help keep your temperature down.  It is common for your temperature to cycle up and down following surgery, especially at night when you are not up moving around and exerting yourself.  The breathing machine keeps your lungs expanded and your temperature down.   DIET:  As you were doing prior to hospitalization, we recommend a well-balanced diet.  DRESSING / WOUND CARE / SHOWERING  Keep the surgical dressing until follow up.  The dressing is water proof, so you can shower without any extra covering.  IF THE DRESSING FALLS OFF or the wound gets wet inside, change the dressing with sterile gauze.  Please use good hand washing techniques before changing the dressing.  Do not use any lotions or creams on the incision until instructed by your surgeon.    ACTIVITY  Increase activity slowly as tolerated, but follow the weight bearing instructions below.   No driving for 6 weeks or until further direction given by your physician.  You cannot drive while taking narcotics.  No lifting or carrying greater than 10 lbs. until further directed by your surgeon. Avoid periods of inactivity such as sitting longer than an hour when not asleep. This helps prevent blood clots.  You may return to work once you are authorized by your doctor.     WEIGHT BEARING   Weight bearing as tolerated with assist device (walker, cane, etc) as directed, use it as long as suggested by your surgeon or therapist, typically at least 4-6 weeks.   EXERCISES  Results after joint replacement surgery are often greatly improved when you follow the exercise, range of motion and muscle strengthening exercises prescribed by your doctor. Safety measures are also important to protect the joint from further injury. Any time any of these exercises cause you to have increased  pain or swelling, decrease what you  are doing until you are comfortable again and then slowly increase them. If you have problems or questions, call your caregiver or physical therapist for advice.   Rehabilitation is important following a joint replacement. After just a few days of immobilization, the muscles of the leg can become weakened and shrink (atrophy).  These exercises are designed to build up the tone and strength of the thigh and leg muscles and to improve motion. Often times heat used for twenty to thirty minutes before working out will loosen up your tissues and help with improving the range of motion but do not use heat for the first two weeks following surgery (sometimes heat can increase post-operative swelling).   These exercises can be done on a training (exercise) mat, on the floor, on a table or on a bed. Use whatever works the best and is most comfortable for you.    Use music or television while you are exercising so that the exercises are a pleasant break in your day. This will make your life better with the exercises acting as a break in your routine that you can look forward to.   Perform all exercises about fifteen times, three times per day or as directed.  You should exercise both the operative leg and the other leg as well.  Exercises include:   Quad Sets - Tighten up the muscle on the front of the thigh (Quad) and hold for 5-10 seconds.   Straight Leg Raises - With your knee straight (if you were given a brace, keep it on), lift the leg to 60 degrees, hold for 3 seconds, and slowly lower the leg.  Perform this exercise against resistance later as your leg gets stronger.  Leg Slides: Lying on your back, slowly slide your foot toward your buttocks, bending your knee up off the floor (only go as far as is comfortable). Then slowly slide your foot back down until your leg is flat on the floor again.  Angel Wings: Lying on your back spread your legs to the side as far apart as you can without causing discomfort.   Hamstring Strength:  Lying on your back, push your heel against the floor with your leg straight by tightening up the muscles of your buttocks.  Repeat, but this time bend your knee to a comfortable angle, and push your heel against the floor.  You may put a pillow under the heel to make it more comfortable if necessary.   A rehabilitation program following joint replacement surgery can speed recovery and prevent re-injury in the future due to weakened muscles. Contact your doctor or a physical therapist for more information on knee rehabilitation.    CONSTIPATION  Constipation is defined medically as fewer than three stools per week and severe constipation as less than one stool per week.  Even if you have a regular bowel pattern at home, your normal regimen is likely to be disrupted due to multiple reasons following surgery.  Combination of anesthesia, postoperative narcotics, change in appetite and fluid intake all can affect your bowels.   YOU MUST use at least one of the following options; they are listed in order of increasing strength to get the job done.  They are all available over the counter, and you may need to use some, POSSIBLY even all of these options:    Drink plenty of fluids (prune juice may be helpful) and high fiber foods Colace 100 mg by mouth twice  a day  Senokot for constipation as directed and as needed Dulcolax (bisacodyl), take with full glass of water  Miralax (polyethylene glycol) once or twice a day as needed.  If you have tried all these things and are unable to have a bowel movement in the first 3-4 days after surgery call either your surgeon or your primary doctor.    If you experience loose stools or diarrhea, hold the medications until you stool forms back up.  If your symptoms do not get better within 1 week or if they get worse, check with your doctor.  If you experience "the worst abdominal pain ever" or develop nausea or vomiting, please contact the office  immediately for further recommendations for treatment.   ITCHING:  If you experience itching with your medications, try taking only a single pain pill, or even half a pain pill at a time.  You can also use Benadryl over the counter for itching or also to help with sleep.   TED HOSE STOCKINGS:  Use stockings on both legs until for at least 2 weeks or as directed by physician office. They may be removed at night for sleeping.  MEDICATIONS:  See your medication summary on the "After Visit Summary" that nursing will review with you.  You may have some home medications which will be placed on hold until you complete the course of blood thinner medication.  It is important for you to complete the blood thinner medication as prescribed.   Blood clot prevention (DVT Prophylaxis): After surgery you are at an increased risk for a blood clot. you were prescribed a blood thinner, Aspirin 81mg , to be taken twice daily for a total of 4 weeks from surgery to help reduce your risk of getting a blood clot. This will help prevent a blood clot. Signs of a pulmonary embolus (blood clot in the lungs) include sudden short of breath, feeling lightheaded or dizzy, chest pain with a deep breath, rapid pulse rapid breathing. Signs of a blood clot in your arms or legs include new unexplained swelling and cramping, warm, red or darkened skin around the painful area. Please call the office or 911 right away if these signs or symptoms develop.  PRECAUTIONS:  If you experience chest pain or shortness of breath - call 911 immediately for transfer to the hospital emergency department.   If you develop a fever greater that 101 F, purulent drainage from wound, increased redness or drainage from wound, foul odor from the wound/dressing, or calf pain - CONTACT YOUR SURGEON.                                                   FOLLOW-UP APPOINTMENTS:  If you do not already have a post-op appointment, please call the office for an  appointment to be seen by your surgeon.  Guidelines for how soon to be seen are listed in your "After Visit Summary", but are typically between 2-3 weeks after surgery.  OTHER INSTRUCTIONS:   Knee Replacement:  Do not place pillow under knee, focus on keeping the knee straight while resting. CPM instructions: 0-90 degrees, 2 hours in the morning, 2 hours in the afternoon, and 2 hours in the evening. Place foam block, curve side up under heel at all times except when in CPM or when walking.  DO NOT modify, tear, cut,  or change the foam block in any way.  POST-OPERATIVE OPIOID TAPER INSTRUCTIONS: It is important to wean off of your opioid medication as soon as possible. If you do not need pain medication after your surgery it is ok to stop day one. Opioids include: Codeine, Hydrocodone(Norco, Vicodin), Oxycodone(Percocet, oxycontin) and hydromorphone amongst others.  Long term and even short term use of opiods can cause: Increased pain response Dependence Constipation Depression Respiratory depression And more.  Withdrawal symptoms can include Flu like symptoms Nausea, vomiting And more Techniques to manage these symptoms Hydrate well Eat regular healthy meals Stay active Use relaxation techniques(deep breathing, meditating, yoga) Do Not substitute Alcohol to help with tapering If you have been on opioids for less than two weeks and do not have pain than it is ok to stop all together.  Plan to wean off of opioids This plan should start within one week post op of your joint replacement. Maintain the same interval or time between taking each dose and first decrease the dose.  Cut the total daily intake of opioids by one tablet each day Next start to increase the time between doses. The last dose that should be eliminated is the evening dose.   MAKE SURE YOU:  Understand these instructions.  Get help right away if you are not doing well or get worse.    Thank you for letting us be  a part of your medical care team.  It is a privilege we respect greatly.  We hope these instructions will help you stay on track for a fast and full recovery!           Signed: Averill Winters A [place] 05/23/2022, 3:50 PM

## 2022-05-23 NOTE — Plan of Care (Signed)
  Problem: Education: Goal: Knowledge of the prescribed therapeutic regimen will improve Outcome: Progressing   Problem: Pain Management: Goal: Pain level will decrease with appropriate interventions Outcome: Progressing   Problem: Nutrition: Goal: Adequate nutrition will be maintained Outcome: Progressing   

## 2022-05-23 NOTE — Plan of Care (Signed)
  Problem: Education: Goal: Knowledge of the prescribed therapeutic regimen will improve Outcome: Progressing Goal: Individualized Educational Video(s) Outcome: Progressing   Problem: Activity: Goal: Ability to avoid complications of mobility impairment will improve Outcome: Progressing Goal: Range of joint motion will improve Outcome: Progressing   Problem: Clinical Measurements: Goal: Postoperative complications will be avoided or minimized Outcome: Progressing   Problem: Pain Management: Goal: Pain level will decrease with appropriate interventions Outcome: Progressing   Problem: Skin Integrity: Goal: Will show signs of wound healing Outcome: Progressing   Problem: Education: Goal: Knowledge of General Education information will improve Description: Including pain rating scale, medication(s)/side effects and non-pharmacologic comfort measures Outcome: Progressing   Problem: Health Behavior/Discharge Planning: Goal: Ability to manage health-related needs will improve Outcome: Progressing   Problem: Clinical Measurements: Goal: Ability to maintain clinical measurements within normal limits will improve Outcome: Progressing Goal: Will remain free from infection Outcome: Progressing Goal: Diagnostic test results will improve Outcome: Progressing Goal: Respiratory complications will improve Outcome: Progressing Goal: Cardiovascular complication will be avoided Outcome: Progressing   Problem: Activity: Goal: Risk for activity intolerance will decrease Outcome: Progressing   Problem: Nutrition: Goal: Adequate nutrition will be maintained Outcome: Progressing   Problem: Coping: Goal: Level of anxiety will decrease Outcome: Progressing   Problem: Elimination: Goal: Will not experience complications related to bowel motility Outcome: Progressing Goal: Will not experience complications related to urinary retention Outcome: Progressing   Problem: Pain  Managment: Goal: General experience of comfort will improve Outcome: Progressing   Problem: Safety: Goal: Ability to remain free from injury will improve Outcome: Progressing   Problem: Skin Integrity: Goal: Risk for impaired skin integrity will decrease Outcome: Progressing   Problem: Education: Goal: Ability to describe self-care measures that may prevent or decrease complications (Diabetes Survival Skills Education) will improve Outcome: Progressing Goal: Individualized Educational Video(s) Outcome: Progressing   Problem: Coping: Goal: Ability to adjust to condition or change in health will improve Outcome: Progressing   Problem: Fluid Volume: Goal: Ability to maintain a balanced intake and output will improve Outcome: Progressing   Problem: Health Behavior/Discharge Planning: Goal: Ability to identify and utilize available resources and services will improve Outcome: Progressing Goal: Ability to manage health-related needs will improve Outcome: Progressing   Problem: Metabolic: Goal: Ability to maintain appropriate glucose levels will improve Outcome: Progressing   Problem: Nutritional: Goal: Maintenance of adequate nutrition will improve Outcome: Progressing Goal: Progress toward achieving an optimal weight will improve Outcome: Progressing   Problem: Skin Integrity: Goal: Risk for impaired skin integrity will decrease Outcome: Progressing   Problem: Tissue Perfusion: Goal: Adequacy of tissue perfusion will improve Outcome: Progressing

## 2022-08-04 ENCOUNTER — Encounter (HOSPITAL_COMMUNITY): Payer: Self-pay

## 2022-08-24 NOTE — Progress Notes (Signed)
Sent message, via epic in basket, requesting orders in epic from surgeon.  

## 2022-08-29 ENCOUNTER — Ambulatory Visit: Payer: Self-pay | Admitting: Emergency Medicine

## 2022-08-29 DIAGNOSIS — G8929 Other chronic pain: Secondary | ICD-10-CM

## 2022-08-29 NOTE — H&P (View-Only) (Signed)
TOTAL KNEE ADMISSION H&P  Patient is being admitted for right total knee arthroplasty.  Subjective:  Chief Complaint:right knee pain.  HPI: Latasha Shepard, 52 y.o. female, has a history of pain and functional disability in the right knee due to arthritis and has failed non-surgical conservative treatments for greater than 12 weeks to includeNSAID's and/or analgesics, corticosteriod injections, viscosupplementation injections, and activity modification.  Onset of symptoms was gradual, starting >10 years ago with gradually worsening course since that time. The patient noted no past surgery on the right knee(s).  Patient currently rates pain in the right knee(s) at 6 out of 10 with activity. Patient has night pain, worsening of pain with activity and weight bearing, pain that interferes with activities of daily living, and pain with passive range of motion.  Patient has evidence of periarticular osteophytes and joint space narrowing by imaging studies.  There is no active infection.  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 She has woke up during surgery. Spinal didn't take well in the past.   Diabetes mellitus without complication (HCC)    GERD (gastroesophageal reflux disease)    Herpes    Hypercholesteremia    Hypertension     Past Surgical History:  Procedure Laterality Date   CESAREAN SECTION     x3   DILATION AND CURETTAGE OF UTERUS     TONSILLECTOMY     90   TOTAL KNEE ARTHROPLASTY Left 05/22/2022   Procedure: TOTAL KNEE ARTHROPLASTY;  Surgeon: Marchwiany, Daniel A, MD;  Location: [facility];  Service: Orthopedics;  Laterality: Left;   TUBAL LIGATION     WISDOM TOOTH EXTRACTION     92    Current Outpatient Medications   Medication Sig Dispense Refill Last Dose   amLODipine (NORVASC) 10 MG tablet Take 10 mg by mouth at bedtime.      atorvastatin (LIPITOR) 80 MG tablet Take 80 mg by mouth at bedtime.      citalopram (CELEXA) 10 MG tablet Take 10 mg by mouth at bedtime.      lisinopril (ZESTRIL) 10 MG tablet Take 10 mg by mouth at bedtime.      metFORMIN (GLUCOPHAGE) 500 MG tablet Take 500 mg by mouth 2 (two) times daily.      omeprazole (PRILOSEC) 20 MG capsule Take 20 mg by mouth at bedtime.      Povidone, PF, (IVIZIA DRY EYES) 0.5 % SOLN Place 1 drop into both eyes 2 (two) times daily.      valACYclovir (VALTREX) 1000 MG tablet Take 500 mg by mouth daily as needed (Cold sore).      No current facility-administered medications for this visit.   Allergies  Allergen Reactions   Aspirin Hives   Nsaids Hives    Social History   Tobacco Use   Smoking status: Former    Types: Cigarettes    Quit date: 2000    Years since quitting: 24.4   Smokeless tobacco: Never  Substance Use Topics   Alcohol use: Yes    Comment: couple times a week    Family History  Problem Relation Age of Onset   Breast cancer Mother    Hypertension Mother      Diabetes Father    Hypertension Father    Melanoma Maternal Grandmother    Stroke Maternal Grandfather    Heart Problems Paternal Grandfather      Review of Systems  Objective:  Physical Exam  Vital signs in last 24 hours: @VSRANGES@  Labs:   Estimated body mass index is 36.21 kg/m as calculated from the following:   Height as of 05/23/22: 5' 4" (1.626 m).   Weight as of 05/23/22: 95.7 kg.   Imaging Review Plain radiographs demonstrate severe degenerative joint disease of the right knee(s). The overall alignment ismild varus. The bone quality appears to be good for age and reported activity level.    Assessment/Plan:  End stage arthritis, right knee   The patient history, physical examination, clinical judgment of the provider and imaging studies  are consistent with end stage degenerative joint disease of the right knee(s) and total knee arthroplasty is deemed medically necessary. The treatment options including medical management, injection therapy arthroscopy and arthroplasty were discussed at length. The risks and benefits of total knee arthroplasty were presented and reviewed. The risks due to aseptic loosening, infection, stiffness, patella tracking problems, thromboembolic complications and other imponderables were discussed. The patient acknowledged the explanation, agreed to proceed with the plan and consent was signed. Patient is being admitted for inpatient treatment for surgery, pain control, PT, OT, prophylactic antibiotics, VTE prophylaxis, progressive ambulation and ADL's and discharge planning. The patient is planning to be eventually discharged with outpatient PT.   Patient's anticipated LOS is less than 2 midnights, meeting these requirements: - Younger than 65 - Lives within 1 hour of care - Has a competent adult at home to recover with post-op recover - NO history of  - Chronic pain requiring opiods  - Diabetes  - Coronary Artery Disease  - Heart failure  - Heart attack  - Stroke  - DVT/VTE  - Cardiac arrhythmia  - Respiratory Failure/COPD  - Renal failure  - Anemia  - Advanced Liver disease   

## 2022-08-29 NOTE — H&P (Signed)
TOTAL KNEE ADMISSION H&P  Patient is being admitted for right total knee arthroplasty.  Subjective:  Chief Complaint:right knee pain.  HPI: Latasha Shepard, 52 y.o. female, has a history of pain and functional disability in the right knee due to arthritis and has failed non-surgical conservative treatments for greater than 12 weeks to includeNSAID's and/or analgesics, corticosteriod injections, viscosupplementation injections, and activity modification.  Onset of symptoms was gradual, starting >10 years ago with gradually worsening course since that time. The patient noted no past surgery on the right knee(s).  Patient currently rates pain in the right knee(s) at 6 out of 10 with activity. Patient has night pain, worsening of pain with activity and weight bearing, pain that interferes with activities of daily living, and pain with passive range of motion.  Patient has evidence of periarticular osteophytes and joint space narrowing by imaging studies.  There is no active infection.  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 She has woke up during surgery. Spinal didn't take well in the past.   Diabetes mellitus without complication (HCC)    GERD (gastroesophageal reflux disease)    Herpes    Hypercholesteremia    Hypertension     Past Surgical History:  Procedure Laterality Date   CESAREAN SECTION     x3   DILATION AND CURETTAGE OF UTERUS     TONSILLECTOMY     90   TOTAL KNEE ARTHROPLASTY Left 05/22/2022   Procedure: TOTAL KNEE ARTHROPLASTY;  Surgeon: Joen Laura, MD;  Location: [facility];  Service: Orthopedics;  Laterality: Left;   TUBAL LIGATION     WISDOM TOOTH EXTRACTION     89    Current Outpatient Medications   Medication Sig Dispense Refill Last Dose   amLODipine (NORVASC) 10 MG tablet Take 10 mg by mouth at bedtime.      atorvastatin (LIPITOR) 80 MG tablet Take 80 mg by mouth at bedtime.      citalopram (CELEXA) 10 MG tablet Take 10 mg by mouth at bedtime.      lisinopril (ZESTRIL) 10 MG tablet Take 10 mg by mouth at bedtime.      metFORMIN (GLUCOPHAGE) 500 MG tablet Take 500 mg by mouth 2 (two) times daily.      omeprazole (PRILOSEC) 20 MG capsule Take 20 mg by mouth at bedtime.      Povidone, PF, (IVIZIA DRY EYES) 0.5 % SOLN Place 1 drop into both eyes 2 (two) times daily.      valACYclovir (VALTREX) 1000 MG tablet Take 500 mg by mouth daily as needed (Cold sore).      No current facility-administered medications for this visit.   Allergies  Allergen Reactions   Aspirin Hives   Nsaids Hives    Social History   Tobacco Use   Smoking status: Former    Types: Cigarettes    Quit date: 2000    Years since quitting: 24.4   Smokeless tobacco: Never  Substance Use Topics   Alcohol use: Yes    Comment: couple times a week    Family History  Problem Relation Age of Onset   Breast cancer Mother    Hypertension Mother  Diabetes Father    Hypertension Father    Melanoma Maternal Grandmother    Stroke Maternal Grandfather    Heart Problems Paternal Grandfather      Review of Systems  Objective:  Physical Exam  Vital signs in last 24 hours: @VSRANGES @  Labs:   Estimated body mass index is 36.21 kg/m as calculated from the following:   Height as of 05/23/22: 5\' 4"  (1.626 m).   Weight as of 05/23/22: 95.7 kg.   Imaging Review Plain radiographs demonstrate severe degenerative joint disease of the right knee(s). The overall alignment ismild varus. The bone quality appears to be good for age and reported activity level.    Assessment/Plan:  End stage arthritis, right knee   The patient history, physical examination, clinical judgment of the provider and imaging studies  are consistent with end stage degenerative joint disease of the right knee(s) and total knee arthroplasty is deemed medically necessary. The treatment options including medical management, injection therapy arthroscopy and arthroplasty were discussed at length. The risks and benefits of total knee arthroplasty were presented and reviewed. The risks due to aseptic loosening, infection, stiffness, patella tracking problems, thromboembolic complications and other imponderables were discussed. The patient acknowledged the explanation, agreed to proceed with the plan and consent was signed. Patient is being admitted for inpatient treatment for surgery, pain control, PT, OT, prophylactic antibiotics, VTE prophylaxis, progressive ambulation and ADL's and discharge planning. The patient is planning to be eventually discharged with outpatient PT.   Patient's anticipated LOS is less than 2 midnights, meeting these requirements: - Younger than 70 - Lives within 1 hour of care - Has a competent adult at home to recover with post-op recover - NO history of  - Chronic pain requiring opiods  - Diabetes  - Coronary Artery Disease  - Heart failure  - Heart attack  - Stroke  - DVT/VTE  - Cardiac arrhythmia  - Respiratory Failure/COPD  - Renal failure  - Anemia  - Advanced Liver disease

## 2022-08-30 NOTE — Patient Instructions (Addendum)
SURGICAL WAITING ROOM VISITATION Patients having surgery or a procedure may have no more than 2 support people in the waiting area - these visitors may rotate in the visitor waiting room.   Due to an increase in RSV and influenza rates and associated hospitalizations, children ages 74 and under may not visit patients in [facility] hospitals. If the patient needs to stay at the hospital during part of their recovery, the visitor guidelines for inpatient rooms apply.  PRE-OP VISITATION  Pre-op nurse will coordinate an appropriate time for 1 support person to accompany the patient in pre-op.  This support person may not rotate.  This visitor will be contacted when the time is appropriate for the visitor to come back in the pre-op area.  Please refer to the [facility] website for the visitor guidelines for Inpatients (after your surgery is over and you are in a regular room).  You are not required to quarantine at this time prior to your surgery. However, you must do this: Hand Hygiene often Do NOT share personal items Notify your provider if you are in close contact with someone who has COVID or you develop fever 100.4 or greater, new onset of sneezing, cough, sore throat, shortness of breath or body aches.  If you test positive for Covid or have been in contact with anyone that has tested positive in the last 10 days please notify you surgeon.    Your procedure is scheduled on:  Wednesday  September 13, 2022  Report to [facility] Main Entrance: [facility] entrance where the [employer] is available.   Report to admitting at:  06:15   AM  Call this number if you have any questions or problems the morning of surgery [phone]  Do not eat food after Midnight the night prior to your surgery/procedure.  After Midnight you may have the following liquids until  05:30 AM DAY OF SURGERY  Clear Liquid Diet Water Black Coffee (sugar ok, NO MILK/CREAM OR CREAMERS)  Tea (sugar ok, NO  MILK/CREAM OR CREAMERS) regular and decaf                             Plain Jell-O  with no fruit (NO RED)                                           Fruit ices (not with fruit pulp, NO RED)                                     Popsicles (NO RED)                                                                  Juice: NO CITRUS JUICES: only apple, WHITE grape, WHITE cranberry Sports drinks like Gatorade or Powerade (NO RED)                    The day of surgery:  Drink ONE (1) Pre-Surgery G2 at   05:30 AM the morning of surgery.  Drink in one sitting. Do not sip.  This drink was given to you during your hospital pre-op appointment visit. Nothing else to drink after completing the Pre-Surgery G2 : No candy, chewing gum or throat lozenges.    FOLLOW  ANY ADDITIONAL PRE OP INSTRUCTIONS YOU RECEIVED FROM YOUR SURGEON'S OFFICE!!!   Oral Hygiene is also important to reduce your risk of infection.        Remember - BRUSH YOUR TEETH THE MORNING OF SURGERY WITH YOUR REGULAR TOOTHPASTE  Do NOT smoke after Midnight the night before surgery.  Take ONLY these medicines the morning of surgery with A SIP OF WATER:   none   If You have been diagnosed with Sleep Apnea - Bring CPAP mask and tubing day of surgery. We will provide you with a CPAP machine on the day of your surgery.                   You may not have any metal on your body including hair pins, jewelry, and body piercing  Do not wear make-up, lotions, powders, perfumes or deodorant  Do not wear nail polish including gel and S&S, artificial / acrylic nails, or any other type of covering on natural nails including finger and toenails. If you have artificial nails, gel coating, etc., that needs to be removed by a nail salon, Please have this removed prior to surgery. Not doing so may mean that your surgery could be cancelled or delayed if the Surgeon or anesthesia staff feels like they are unable to monitor you safely.   Do not shave 48 hours  prior to surgery to avoid nicks in your skin which may contribute to postoperative infections.    Contacts, Hearing Aids, dentures or bridgework may not be worn into surgery. DENTURES WILL BE REMOVED PRIOR TO SURGERY PLEASE DO NOT APPLY "Poly grip" OR ADHESIVES!!!  You may bring a small overnight bag with you on the day of surgery, only pack items that are not valuable. [facility] IS NOT RESPONSIBLE   FOR VALUABLES THAT ARE LOST OR STOLEN.   Do not bring your home medications to the hospital. The Pharmacy will dispense medications listed on your medication list to you during your admission in the Hospital.  Special Instructions: Bring a copy of your healthcare power of attorney and living will documents the day of surgery, if you wish to have them scanned into your [facility]- EPIC  Please read over the following fact sheets you were given: IF YOU HAVE QUESTIONS ABOUT YOUR PRE-OP INSTRUCTIONS, PLEASE CALL [phone].     Pre-operative 5 CHG Bath Instructions   You can play a key role in reducing the risk of infection after surgery. Your skin needs to be as free of germs as possible. You can reduce the number of germs on your skin by washing with CHG (chlorhexidine gluconate) soap before surgery. CHG is an antiseptic soap that kills germs and continues to kill germs even after washing.   DO NOT use if you have an allergy to chlorhexidine/CHG or antibacterial soaps. If your skin becomes reddened or irritated, stop using the CHG and notify one of our RNs at [phone]  Please shower with the CHG soap starting 4 days before surgery using the following schedule: START SHOWERS ON SATURDAY  September 09, 2022  Please keep in mind the following:  DO NOT shave, including legs and underarms, starting the  day of your first shower.   You may shave your face at any point before/day of surgery.   Place clean sheets on your bed the day you start using CHG soap. Use a clean washcloth (not used since being washed) for each shower. DO NOT sleep with pets once you start using the CHG.   CHG Shower Instructions:  If you choose to wash your hair and private area, wash first with your normal shampoo/soap.  After you use shampoo/soap, rinse your hair and body thoroughly to remove shampoo/soap residue.  Turn the water OFF and apply about 3 tablespoons (45 ml) of CHG soap to a CLEAN washcloth.  Apply CHG soap ONLY FROM YOUR NECK DOWN TO YOUR TOES (washing for 3-5 minutes)  DO NOT use CHG soap on face, private areas, open wounds, or sores.  Pay special attention to the area where your surgery is being performed.  If you are having back surgery, having someone wash your back for you may be helpful.  Wait 2 minutes after CHG soap is applied, then you may rinse off the CHG soap.  Pat dry with a clean towel  Put on clean clothes/pajamas   If you choose to wear lotion, please use ONLY the CHG-compatible lotions on the back of this paper.     Additional instructions for the day of surgery: DO NOT APPLY any lotions, deodorants, cologne, or perfumes.   Put on clean/comfortable clothes.  Brush your teeth.  Ask your nurse before applying any prescription medications to the skin.      CHG Compatible Lotions   Aveeno Moisturizing lotion  Cetaphil Moisturizing Cream  Cetaphil Moisturizing Lotion  Clairol Herbal Essence Moisturizing Lotion, Dry Skin  Clairol Herbal Essence Moisturizing Lotion, Extra Dry Skin  Clairol Herbal Essence Moisturizing Lotion, Normal Skin  Curel Age Defying Therapeutic Moisturizing Lotion with Alpha Hydroxy  Curel Extreme Care Body Lotion  Curel Soothing Hands Moisturizing Hand Lotion  Curel Therapeutic Moisturizing Cream, Fragrance-Free  Curel Therapeutic Moisturizing Lotion,  Fragrance-Free  Curel Therapeutic Moisturizing Lotion, Original Formula  Eucerin Daily Replenishing Lotion  Eucerin Dry Skin Therapy Plus Alpha Hydroxy Crme  Eucerin Dry Skin Therapy Plus Alpha Hydroxy Lotion  Eucerin Original Crme  Eucerin Original Lotion  Eucerin Plus Crme Eucerin Plus Lotion  Eucerin TriLipid Replenishing Lotion  Keri Anti-Bacterial Hand Lotion  Keri Deep Conditioning Original Lotion Dry Skin Formula Softly Scented  Keri Deep Conditioning Original Lotion, Fragrance Free Sensitive Skin Formula  Keri Lotion Fast Absorbing Fragrance Free Sensitive Skin Formula  Keri Lotion Fast Absorbing Softly Scented Dry Skin Formula  Keri Original Lotion  Keri Skin Renewal Lotion Keri Silky Smooth Lotion  Keri Silky Smooth Sensitive Skin Lotion  Nivea Body Creamy Conditioning Oil  Nivea Body Extra Enriched Lotion  Nivea Body Original Lotion  Nivea Body Sheer Moisturizing Lotion Nivea Crme  Nivea Skin Firming Lotion  NutraDerm 30 Skin Lotion  NutraDerm Skin Lotion  NutraDerm Therapeutic Skin Cream  NutraDerm Therapeutic Skin Lotion  ProShield Protective Hand Cream  Provon moisturizing lotion    FAILURE TO FOLLOW THESE INSTRUCTIONS MAY RESULT IN THE CANCELLATION OF YOUR SURGERY  PATIENT SIGNATURE_________________________________  NURSE SIGNATURE__________________________________  ________________________________________________________________________       Rogelia Mire    An incentive spirometer is a tool that can help keep your lungs clear and active. This tool measures how well you are filling your lungs with each breath.  Taking long deep breaths may help reverse or decrease the chance of developing breathing (pulmonary) problems (especially infection) following: A long period of time when you are unable to move or be active. BEFORE THE PROCEDURE  If the spirometer includes an indicator to show your best effort, your nurse or respiratory therapist  will set it to a desired goal. If possible, sit up straight or lean slightly forward. Try not to slouch. Hold the incentive spirometer in an upright position. INSTRUCTIONS FOR USE  Sit on the edge of your bed if possible, or sit up as far as you can in bed or on a chair. Hold the incentive spirometer in an upright position. Breathe out normally. Place the mouthpiece in your mouth and seal your lips tightly around it. Breathe in slowly and as deeply as possible, raising the piston or the ball toward the top of the column. Hold your breath for 3-5 seconds or for as long as possible. Allow the piston or ball to fall to the bottom of the column. Remove the mouthpiece from your mouth and breathe out normally. Rest for a few seconds and repeat Steps 1 through 7 at least 10 times every 1-2 hours when you are awake. Take your time and take a few normal breaths between deep breaths. The spirometer may include an indicator to show your best effort. Use the indicator as a goal to work toward during each repetition. After each set of 10 deep breaths, practice coughing to be sure your lungs are clear. If you have an incision (the cut made at the time of surgery), support your incision when coughing by placing a pillow or rolled up towels firmly against it. Once you are able to get out of bed, walk around indoors and cough well. You may stop using the incentive spirometer when instructed by your caregiver.  RISKS AND COMPLICATIONS Take your time so you do not get dizzy or light-headed. If you are in pain, you may need to take or ask for pain medication before doing incentive spirometry. It is harder to take a deep breath if you are having pain. AFTER USE Rest and breathe slowly and easily. It can be helpful to keep track of a log of your progress. Your caregiver can provide you with a simple table to help with this. If you are using the spirometer at home, follow these instructions: SEEK MEDICAL CARE IF:   You are having difficultly using the spirometer. You have trouble using the spirometer as often as instructed. Your pain medication is not giving enough relief while using the spirometer. You develop fever of 100.5 F (38.1 C) or higher.                                                                                                    SEEK IMMEDIATE MEDICAL CARE IF:  You cough up bloody sputum that had not been present before. You develop fever of 102 F (38.9 C) or greater. You develop worsening pain at or near the incision site. MAKE SURE YOU:  Understand these instructions. Will watch your  condition. Will get help right away if you are not doing well or get worse. Document Released: 07/03/2006 Document Revised: 05/15/2011 Document Reviewed: 09/03/2006 [facility] Patient Information 2014 [place], [place].      WHAT IS A BLOOD TRANSFUSION? Blood Transfusion Information  A transfusion is the replacement of blood or some of its parts. Blood is made up of multiple cells which provide different functions. Red blood cells carry oxygen and are used for blood loss replacement. White blood cells fight against infection. Platelets control bleeding. Plasma helps clot blood. Other blood products are available for specialized needs, such as hemophilia or other clotting disorders. BEFORE THE TRANSFUSION  Who gives blood for transfusions?  Healthy volunteers who are fully evaluated to make sure their blood is safe. This is blood bank blood. Transfusion therapy is the safest it has ever been in the practice of medicine. Before blood is taken from a donor, a complete history is taken to make sure that person has no history of diseases nor engages in risky social behavior (examples are intravenous drug use or sexual activity with multiple partners). The donor's travel history is screened to minimize risk of transmitting infections, such as malaria. The donated blood is tested for signs of infectious  diseases, such as HIV and hepatitis. The blood is then tested to be sure it is compatible with you in order to minimize the chance of a transfusion reaction. If you or a relative donates blood, this is often done in anticipation of surgery and is not appropriate for emergency situations. It takes many days to process the donated blood. RISKS AND COMPLICATIONS Although transfusion therapy is very safe and saves many lives, the main dangers of transfusion include:  Getting an infectious disease. Developing a transfusion reaction. This is an allergic reaction to something in the blood you were given. Every precaution is taken to prevent this. The decision to have a blood transfusion has been considered carefully by your caregiver before blood is given. Blood is not given unless the benefits outweigh the risks. AFTER THE TRANSFUSION Right after receiving a blood transfusion, you will usually feel much better and more energetic. This is especially true if your red blood cells have gotten low (anemic). The transfusion raises the level of the red blood cells which carry oxygen, and this usually causes an energy increase. The nurse administering the transfusion will monitor you carefully for complications. HOME CARE INSTRUCTIONS  No special instructions are needed after a transfusion. You may find your energy is better. Speak with your caregiver about any limitations on activity for underlying diseases you may have. SEEK MEDICAL CARE IF:  Your condition is not improving after your transfusion. You develop redness or irritation at the intravenous (IV) site. SEEK IMMEDIATE MEDICAL CARE IF:  Any of the following symptoms occur over the next 12 hours: Shaking chills. You have a temperature by mouth above 102 F (38.9 C), not controlled by medicine. Chest, back, or muscle pain. People around you feel you are not acting correctly or are confused. Shortness of breath or difficulty breathing. Dizziness and  fainting. You get a rash or develop hives. You have a decrease in urine output. Your urine turns a dark color or changes to pink, red, or brown. Any of the following symptoms occur over the next 10 days: You have a temperature by mouth above 102 F (38.9 C), not controlled by medicine. Shortness of breath. Weakness after normal activity. The white part of the eye turns yellow (jaundice). You have  a decrease in the amount of urine or are urinating less often. Your urine turns a dark color or changes to pink, red, or brown. Document Released: 02/18/2000 Document Revised: 05/15/2011 Document Reviewed: 10/07/2007 [facility] Patient Information 2014 [place], [place].          _______________________________________________________________________

## 2022-08-30 NOTE — Progress Notes (Signed)
COVID Vaccine received:  []  No [x]  Yes Date of any COVID positive Test in last 90 days:  None  PCP - Milus Height, PA  at [facility] on chart    [phone] Cardiologist - none  Chest x-ray -  EKG -  05-10-2022  Epic Stress Test -  ECHO -  Cardiac Cath -   PCR screen: [x]  Ordered & Completed           []   No Order but Needs PROFEND           []   N/A for this surgery  Surgery Plan:  []  Ambulatory                            [x]  Outpatient in bed                            []  Admit  Anesthesia:    []  General  [x]  Spinal                           []   Choice []   MAC  Pacemaker / ICD device [x]  No []  Yes   Spinal Cord Stimulator:[x]  No []  Yes       History of Sleep Apnea? [x]  No []  Yes   CPAP used?- [x]  No []  Yes    Does the patient monitor blood sugar?          []  No [x]  Yes  []  N/A Last A1c on 05-10-2022 was normal at 5.3  Patient has: []  NO Hx DM   [x]  Pre-DM                 []  DM1  []   DM2 Does patient have a [employer] or Dexacom? [x]  No []  Yes   Fasting Blood Sugar Ranges-  Checks Blood Sugar _1____ times a day Metformin 500mg  bid    Stopped taking Ozempic  I called for the Pharm. Tech to reconcile patient's  meds. Rowe Clack). Chanelle was in with another patient and Mrs. Swader had to leave, so I did the Med. Recon.   Blood Thinner / Instructions:  none Aspirin Instructions:  None  ERAS Protocol Ordered: []  No  [x]  Yes PRE-SURGERY []  ENSURE  [x]  G2  Patient is to be NPO after: 05:30  Comments: Patient was given the 5 CHG shower / bath instructions for TKA surgery along with 2 bottles of the CHG soap. Patient will start this on: Saturday  09-09-2022     All questions were asked and answered, Patient voiced understanding of this process.   Activity level: Patient is able to climb a flight of stairs without difficulty; [x]  No CP  [x]  No SOB, but would have leg pain. Patient can perform ADLs without assistance.   Anesthesia review: OSA -   HTN, GERD, Anes. Complication: Red head, woke up during 1 surgery and spinal did not take- gave her a femoral block instead with last TKA.   Patient denies shortness of breath, fever, cough and chest pain at PAT appointment.  Patient verbalized understanding and agreement to the Pre-Surgical Instructions that were given to them at this PAT appointment. Patient was also educated of the need to review these PAT instructions again prior to her surgery.I reviewed the appropriate phone numbers to call if they have any and questions or concerns.

## 2022-08-31 ENCOUNTER — Encounter (HOSPITAL_COMMUNITY)
Admission: RE | Admit: 2022-08-31 | Discharge: 2022-08-31 | Disposition: A | Discharge status: Home / Self Care | Payer: Managed Care, Other (non HMO) | Source: Ambulatory Visit | Attending: Orthopedic Surgery | Admitting: Orthopedic Surgery

## 2022-08-31 ENCOUNTER — Encounter (HOSPITAL_COMMUNITY): Payer: Self-pay

## 2022-08-31 ENCOUNTER — Other Ambulatory Visit: Payer: Self-pay

## 2022-08-31 VITALS — BP 128/86 | HR 69 | Temp 98.2°F | Resp 18 | Ht 64.0 in | Wt 225.0 lb

## 2022-08-31 DIAGNOSIS — G8929 Other chronic pain: Secondary | ICD-10-CM | POA: Diagnosis not present

## 2022-08-31 DIAGNOSIS — Z01812 Encounter for preprocedural laboratory examination: Secondary | ICD-10-CM | POA: Insufficient documentation

## 2022-08-31 DIAGNOSIS — M25561 Pain in right knee: Secondary | ICD-10-CM | POA: Insufficient documentation

## 2022-08-31 DIAGNOSIS — Z01818 Encounter for other preprocedural examination: Secondary | ICD-10-CM

## 2022-08-31 LAB — SURGICAL PCR SCREEN
MRSA, PCR: NEGATIVE
Staphylococcus aureus: NEGATIVE

## 2022-08-31 LAB — COMPREHENSIVE METABOLIC PANEL
ALT: 21 U/L (ref 0–44)
AST: 17 U/L (ref 15–41)
Albumin: 4.5 g/dL (ref 3.5–5.0)
Alkaline Phosphatase: 70 U/L (ref 38–126)
Anion gap: 11 (ref 5–15)
BUN: 12 mg/dL (ref 6–20)
CO2: 24 mmol/L (ref 22–32)
Calcium: 9 mg/dL (ref 8.9–10.3)
Chloride: 103 mmol/L (ref 98–111)
Creatinine, Ser: 0.58 mg/dL (ref 0.44–1.00)
GFR, Estimated: >60 mL/min (ref >60)
Glucose, Bld: 91 mg/dL (ref 70–99)
Potassium: 4.1 mmol/L (ref 3.5–5.1)
Sodium: 138 mmol/L (ref 135–145)
Total Bilirubin: 0.7 mg/dL (ref 0.3–1.2)
Total Protein: 7.8 g/dL (ref 6.5–8.1)

## 2022-08-31 LAB — CBC WITH DIFFERENTIAL/PLATELET
Abs Immature Granulocytes: 0.03 10*3/uL (ref 0.00–0.07)
Basophils Absolute: 0.1 10*3/uL (ref 0.0–0.1)
Basophils Relative: 1 %
Eosinophils Absolute: 0.2 10*3/uL (ref 0.0–0.5)
Eosinophils Relative: 2 %
HCT: 38.6 % (ref 36.0–46.0)
Hemoglobin: 12.8 g/dL (ref 12.0–15.0)
Immature Granulocytes: 0 %
Lymphocytes Relative: 39 %
Lymphs Abs: 3.3 10*3/uL (ref 0.7–4.0)
MCH: 29.6 pg (ref 26.0–34.0)
MCHC: 33.2 g/dL (ref 30.0–36.0)
MCV: 89.1 fL (ref 80.0–100.0)
Monocytes Absolute: 0.5 10*3/uL (ref 0.1–1.0)
Monocytes Relative: 6 %
Neutro Abs: 4.3 10*3/uL (ref 1.7–7.7)
Neutrophils Relative %: 52 %
Platelets: 287 10*3/uL (ref 150–400)
RBC: 4.33 MIL/uL (ref 3.87–5.11)
RDW: 13.2 % (ref 11.5–15.5)
WBC: 8.4 10*3/uL (ref 4.0–10.5)
nRBC: 0 % (ref 0.0–0.2)

## 2022-08-31 LAB — TYPE AND SCREEN
ABO/RH(D): A POS
Antibody Screen: NEGATIVE

## 2022-09-13 ENCOUNTER — Other Ambulatory Visit: Payer: Self-pay

## 2022-09-13 ENCOUNTER — Encounter (HOSPITAL_COMMUNITY)
Admission: RE | Disposition: A | Discharge status: Home / Self Care | Payer: Self-pay | Source: Ambulatory Visit | Attending: Orthopedic Surgery

## 2022-09-13 ENCOUNTER — Encounter (HOSPITAL_COMMUNITY): Payer: Self-pay | Admitting: Orthopedic Surgery

## 2022-09-13 ENCOUNTER — Observation Stay (HOSPITAL_COMMUNITY): Payer: Managed Care, Other (non HMO)

## 2022-09-13 ENCOUNTER — Ambulatory Visit (HOSPITAL_BASED_OUTPATIENT_CLINIC_OR_DEPARTMENT_OTHER): Payer: Managed Care, Other (non HMO) | Admitting: Registered Nurse

## 2022-09-13 ENCOUNTER — Observation Stay (HOSPITAL_COMMUNITY)
Admission: RE | Admit: 2022-09-13 | Discharge: 2022-09-14 | Disposition: A | Discharge status: Home / Self Care | Payer: Managed Care, Other (non HMO) | Source: Ambulatory Visit | Attending: Orthopedic Surgery | Admitting: Orthopedic Surgery

## 2022-09-13 ENCOUNTER — Ambulatory Visit (HOSPITAL_COMMUNITY): Payer: Self-pay | Admitting: Registered Nurse

## 2022-09-13 DIAGNOSIS — M1711 Unilateral primary osteoarthritis, right knee: Secondary | ICD-10-CM | POA: Diagnosis present

## 2022-09-13 DIAGNOSIS — I10 Essential (primary) hypertension: Secondary | ICD-10-CM | POA: Diagnosis not present

## 2022-09-13 DIAGNOSIS — E119 Type 2 diabetes mellitus without complications: Secondary | ICD-10-CM | POA: Insufficient documentation

## 2022-09-13 DIAGNOSIS — Z7984 Long term (current) use of oral hypoglycemic drugs: Secondary | ICD-10-CM | POA: Diagnosis not present

## 2022-09-13 DIAGNOSIS — Z96652 Presence of left artificial knee joint: Secondary | ICD-10-CM | POA: Diagnosis not present

## 2022-09-13 DIAGNOSIS — Z79899 Other long term (current) drug therapy: Secondary | ICD-10-CM | POA: Diagnosis not present

## 2022-09-13 DIAGNOSIS — Z87891 Personal history of nicotine dependence: Secondary | ICD-10-CM | POA: Insufficient documentation

## 2022-09-13 DIAGNOSIS — R7303 Prediabetes: Secondary | ICD-10-CM

## 2022-09-13 DIAGNOSIS — Z01818 Encounter for other preprocedural examination: Principal | ICD-10-CM

## 2022-09-13 HISTORY — PX: TOTAL KNEE ARTHROPLASTY: SHX125

## 2022-09-13 LAB — GLUCOSE, CAPILLARY
Glucose-Capillary: 128 mg/dL — ABNORMAL HIGH (ref 70–99)
Glucose-Capillary: 115 mg/dL — ABNORMAL HIGH (ref 70–99)

## 2022-09-13 LAB — ABO/RH: ABO/RH(D): A POS

## 2022-09-13 SURGERY — ARTHROPLASTY, KNEE, TOTAL
Anesthesia: Monitor Anesthesia Care | Site: Knee | Laterality: Right

## 2022-09-13 MED ORDER — PHENYLEPHRINE HCL-NACL 20-0.9 MG/250ML-% IV SOLN
INTRAVENOUS | Status: DC | PRN
  Administered 2022-09-13: 30 ug/min via INTRAVENOUS

## 2022-09-13 MED ORDER — METHOCARBAMOL 500 MG PO TABS: 500.0000 mg | ORAL_TABLET | Freq: Three times a day (TID) | ORAL | 0 refills | Status: AC | PRN

## 2022-09-13 MED ORDER — ACETAMINOPHEN 500 MG PO TABS
1000.0000 mg | ORAL_TABLET | Freq: Four times a day (QID) | ORAL | Status: AC
  Administered 2022-09-13 – 2022-09-14 (×2): 1000 mg via ORAL
  Filled 2022-09-13 (×2): qty 2

## 2022-09-13 MED ORDER — OXYCODONE HCL 5 MG/5ML PO SOLN: 5.0000 mg | Freq: Once | ORAL | Status: AC | PRN

## 2022-09-13 MED ORDER — LACTATED RINGERS IV SOLN: INTRAVENOUS | Status: DC

## 2022-09-13 MED ORDER — CEFAZOLIN SODIUM-DEXTROSE 2-4 GM/100ML-% IV SOLN
2.0000 g | Freq: Four times a day (QID) | INTRAVENOUS | Status: AC
  Administered 2022-09-13 (×2): 2 g via INTRAVENOUS
  Filled 2022-09-13 (×2): qty 100

## 2022-09-13 MED ORDER — METFORMIN HCL 500 MG PO TABS
500.0000 mg | ORAL_TABLET | Freq: Two times a day (BID) | ORAL | Status: DC
  Administered 2022-09-13 – 2022-09-14 (×2): 500 mg via ORAL
  Filled 2022-09-13 (×2): qty 1

## 2022-09-13 MED ORDER — ONDANSETRON HCL 4 MG PO TABS
4.0000 mg | ORAL_TABLET | Freq: Four times a day (QID) | ORAL | Status: DC | PRN
  Administered 2022-09-13: 4 mg via ORAL
  Filled 2022-09-13: qty 1

## 2022-09-13 MED ORDER — ASPIRIN 81 MG PO CHEW
81.0000 mg | CHEWABLE_TABLET | Freq: Two times a day (BID) | ORAL | Status: DC
  Administered 2022-09-13 – 2022-09-14 (×2): 81 mg via ORAL
  Filled 2022-09-13 (×2): qty 1

## 2022-09-13 MED ORDER — OXYCODONE HCL 5 MG PO TABS
5.0000 mg | ORAL_TABLET | Freq: Once | ORAL | Status: AC | PRN
  Administered 2022-09-13: 5 mg via ORAL

## 2022-09-13 MED ORDER — CHLORHEXIDINE GLUCONATE 0.12 % MT SOLN
15.0000 mL | Freq: Once | OROMUCOSAL | Status: AC
  Administered 2022-09-13: 15 mL via OROMUCOSAL

## 2022-09-13 MED ORDER — FENTANYL CITRATE (PF) 100 MCG/2ML IJ SOLN
INTRAMUSCULAR | Status: DC | PRN
  Administered 2022-09-13 (×2): 50 ug via INTRAVENOUS

## 2022-09-13 MED ORDER — ORAL CARE MOUTH RINSE: 15.0000 mL | Freq: Once | OROMUCOSAL | Status: AC

## 2022-09-13 MED ORDER — ACETAMINOPHEN 500 MG PO TABS
1000.0000 mg | ORAL_TABLET | Freq: Once | ORAL | Status: AC
  Administered 2022-09-13: 1000 mg via ORAL
  Filled 2022-09-13: qty 2

## 2022-09-13 MED ORDER — ISOPROPYL ALCOHOL 70 % SOLN
Status: DC | PRN
  Administered 2022-09-13: 1 via TOPICAL

## 2022-09-13 MED ORDER — ONDANSETRON HCL 4 MG PO TABS: 4.0000 mg | ORAL_TABLET | Freq: Three times a day (TID) | ORAL | 0 refills | Status: AC | PRN

## 2022-09-13 MED ORDER — PROPOFOL 1000 MG/100ML IV EMUL
INTRAVENOUS | Status: AC
  Filled 2022-09-13: qty 200

## 2022-09-13 MED ORDER — PHENOL 1.4 % MT LIQD: 1.0000 | OROMUCOSAL | Status: DC | PRN

## 2022-09-13 MED ORDER — KETOROLAC TROMETHAMINE 15 MG/ML IJ SOLN
7.5000 mg | Freq: Four times a day (QID) | INTRAMUSCULAR | Status: AC
  Administered 2022-09-13 – 2022-09-14 (×3): 7.5 mg via INTRAVENOUS
  Filled 2022-09-13 (×3): qty 1

## 2022-09-13 MED ORDER — PROPOFOL 10 MG/ML IV BOLUS
INTRAVENOUS | Status: DC | PRN
  Administered 2022-09-13 (×2): 30 mg via INTRAVENOUS

## 2022-09-13 MED ORDER — ISOPROPYL ALCOHOL 70 % SOLN
Status: AC
  Filled 2022-09-13: qty 480

## 2022-09-13 MED ORDER — MENTHOL 3 MG MT LOZG: 1.0000 | LOZENGE | OROMUCOSAL | Status: DC | PRN

## 2022-09-13 MED ORDER — CITALOPRAM HYDROBROMIDE 20 MG PO TABS
10.0000 mg | ORAL_TABLET | Freq: Every day | ORAL | Status: DC
  Administered 2022-09-13: 10 mg via ORAL
  Filled 2022-09-13: qty 1

## 2022-09-13 MED ORDER — CELECOXIB 100 MG PO CAPS: 100.0000 mg | ORAL_CAPSULE | Freq: Two times a day (BID) | ORAL | 0 refills | Status: AC

## 2022-09-13 MED ORDER — FENTANYL CITRATE PF 50 MCG/ML IJ SOSY
PREFILLED_SYRINGE | INTRAMUSCULAR | Status: AC
  Filled 2022-09-13: qty 1

## 2022-09-13 MED ORDER — BUPIVACAINE LIPOSOME 1.3 % IJ SUSP: 20.0000 mL | Freq: Once | INTRAMUSCULAR | Status: DC

## 2022-09-13 MED ORDER — BUPIVACAINE-EPINEPHRINE 0.25% -1:200000 IJ SOLN
INTRAMUSCULAR | Status: AC
  Filled 2022-09-13: qty 1

## 2022-09-13 MED ORDER — HYDROMORPHONE HCL 1 MG/ML IJ SOLN
0.5000 mg | INTRAMUSCULAR | Status: DC | PRN
  Administered 2022-09-13 – 2022-09-14 (×3): 1 mg via INTRAVENOUS
  Filled 2022-09-13 (×3): qty 1

## 2022-09-13 MED ORDER — ONDANSETRON HCL 4 MG/2ML IJ SOLN
INTRAMUSCULAR | Status: DC | PRN
  Administered 2022-09-13: 4 mg via INTRAVENOUS

## 2022-09-13 MED ORDER — MIDAZOLAM HCL 2 MG/2ML IJ SOLN
INTRAMUSCULAR | Status: AC
  Filled 2022-09-13: qty 2

## 2022-09-13 MED ORDER — POLYETHYLENE GLYCOL 3350 17 G PO PACK: 17.0000 g | PACK | Freq: Every day | ORAL | Status: DC | PRN

## 2022-09-13 MED ORDER — ONDANSETRON HCL 4 MG/2ML IJ SOLN: 4.0000 mg | Freq: Four times a day (QID) | INTRAMUSCULAR | Status: DC | PRN

## 2022-09-13 MED ORDER — ACETAMINOPHEN 325 MG PO TABS: 325.0000 mg | ORAL_TABLET | Freq: Four times a day (QID) | ORAL | Status: DC | PRN

## 2022-09-13 MED ORDER — AMLODIPINE BESYLATE 10 MG PO TABS
10.0000 mg | ORAL_TABLET | Freq: Every day | ORAL | Status: DC
  Administered 2022-09-13: 10 mg via ORAL
  Filled 2022-09-13: qty 1

## 2022-09-13 MED ORDER — BUPIVACAINE IN DEXTROSE 0.75-8.25 % IT SOLN
INTRATHECAL | Status: DC | PRN
  Administered 2022-09-13: 1.6 mL via INTRATHECAL

## 2022-09-13 MED ORDER — METHOCARBAMOL 1000 MG/10ML IJ SOLN
500.0000 mg | Freq: Four times a day (QID) | INTRAVENOUS | Status: DC | PRN
  Filled 2022-09-13: qty 5

## 2022-09-13 MED ORDER — CEFAZOLIN SODIUM-DEXTROSE 2-4 GM/100ML-% IV SOLN
2.0000 g | INTRAVENOUS | Status: AC
  Administered 2022-09-13: 2 g via INTRAVENOUS
  Filled 2022-09-13: qty 100

## 2022-09-13 MED ORDER — OXYCODONE HCL 5 MG PO TABS
ORAL_TABLET | ORAL | Status: AC
  Filled 2022-09-13: qty 1

## 2022-09-13 MED ORDER — STERILE WATER FOR IRRIGATION IR SOLN
Status: DC | PRN
  Administered 2022-09-13: 1000 mL

## 2022-09-13 MED ORDER — OXYCODONE HCL 5 MG PO TABS: 5.0000 mg | ORAL_TABLET | ORAL | 0 refills | Status: AC | PRN

## 2022-09-13 MED ORDER — FENTANYL CITRATE (PF) 100 MCG/2ML IJ SOLN
INTRAMUSCULAR | Status: AC
  Filled 2022-09-13: qty 2

## 2022-09-13 MED ORDER — DEXAMETHASONE SODIUM PHOSPHATE 10 MG/ML IJ SOLN
8.0000 mg | Freq: Once | INTRAMUSCULAR | Status: AC
  Administered 2022-09-13: 8 mg via INTRAVENOUS

## 2022-09-13 MED ORDER — BUPIVACAINE LIPOSOME 1.3 % IJ SUSP
INTRAMUSCULAR | Status: AC
  Filled 2022-09-13: qty 20

## 2022-09-13 MED ORDER — POVIDONE-IODINE 10 % EX SWAB: 2.0000 | Freq: Once | CUTANEOUS | Status: DC

## 2022-09-13 MED ORDER — FENTANYL CITRATE PF 50 MCG/ML IJ SOSY
25.0000 ug | PREFILLED_SYRINGE | INTRAMUSCULAR | Status: DC | PRN
  Administered 2022-09-13 (×2): 50 ug via INTRAVENOUS

## 2022-09-13 MED ORDER — METHOCARBAMOL 500 MG PO TABS
500.0000 mg | ORAL_TABLET | Freq: Four times a day (QID) | ORAL | Status: DC | PRN
  Administered 2022-09-14: 500 mg via ORAL
  Filled 2022-09-13: qty 1

## 2022-09-13 MED ORDER — SODIUM CHLORIDE 0.9 % IR SOLN
Status: DC | PRN
  Administered 2022-09-13: 3000 mL

## 2022-09-13 MED ORDER — DOCUSATE SODIUM 100 MG PO CAPS
100.0000 mg | ORAL_CAPSULE | Freq: Two times a day (BID) | ORAL | Status: DC
  Administered 2022-09-13 – 2022-09-14 (×3): 100 mg via ORAL
  Filled 2022-09-13 (×3): qty 1

## 2022-09-13 MED ORDER — DEXAMETHASONE SODIUM PHOSPHATE 10 MG/ML IJ SOLN
INTRAMUSCULAR | Status: AC
  Filled 2022-09-13: qty 1

## 2022-09-13 MED ORDER — SODIUM CHLORIDE (PF) 0.9 % IJ SOLN
INTRAMUSCULAR | Status: DC | PRN
  Administered 2022-09-13: 80 mL

## 2022-09-13 MED ORDER — ASPIRIN 81 MG PO TBEC: 81.0000 mg | DELAYED_RELEASE_TABLET | Freq: Two times a day (BID) | ORAL | 0 refills | Status: AC

## 2022-09-13 MED ORDER — ATORVASTATIN CALCIUM 40 MG PO TABS
80.0000 mg | ORAL_TABLET | Freq: Every day | ORAL | Status: DC
  Administered 2022-09-13: 80 mg via ORAL
  Filled 2022-09-13: qty 2

## 2022-09-13 MED ORDER — 0.9 % SODIUM CHLORIDE (POUR BTL) OPTIME
TOPICAL | Status: DC | PRN
  Administered 2022-09-13: 1000 mL

## 2022-09-13 MED ORDER — SODIUM CHLORIDE 0.9 % IV SOLN: INTRAVENOUS | Status: DC

## 2022-09-13 MED ORDER — ONDANSETRON HCL 4 MG/2ML IJ SOLN
4.0000 mg | Freq: Four times a day (QID) | INTRAMUSCULAR | Status: DC | PRN
  Administered 2022-09-14: 4 mg via INTRAVENOUS
  Filled 2022-09-13: qty 2

## 2022-09-13 MED ORDER — PROPOFOL 500 MG/50ML IV EMUL
INTRAVENOUS | Status: DC | PRN
  Administered 2022-09-13: 100 ug/kg/min via INTRAVENOUS

## 2022-09-13 MED ORDER — INSULIN ASPART 100 UNIT/ML IJ SOLN: 0.0000 [IU] | INTRAMUSCULAR | Status: DC | PRN

## 2022-09-13 MED ORDER — PANTOPRAZOLE SODIUM 40 MG PO TBEC
40.0000 mg | DELAYED_RELEASE_TABLET | Freq: Every day | ORAL | Status: DC
  Administered 2022-09-13 – 2022-09-14 (×2): 40 mg via ORAL
  Filled 2022-09-13 (×2): qty 1

## 2022-09-13 MED ORDER — ONDANSETRON HCL 4 MG/2ML IJ SOLN
INTRAMUSCULAR | Status: AC
  Filled 2022-09-13: qty 2

## 2022-09-13 MED ORDER — TRANEXAMIC ACID-NACL 1000-0.7 MG/100ML-% IV SOLN
1000.0000 mg | INTRAVENOUS | Status: AC
  Administered 2022-09-13: 1000 mg via INTRAVENOUS
  Filled 2022-09-13: qty 100

## 2022-09-13 MED ORDER — LISINOPRIL 10 MG PO TABS
10.0000 mg | ORAL_TABLET | Freq: Every day | ORAL | Status: DC
  Administered 2022-09-13: 10 mg via ORAL
  Filled 2022-09-13: qty 1

## 2022-09-13 MED ORDER — MIDAZOLAM HCL 5 MG/5ML IJ SOLN
INTRAMUSCULAR | Status: DC | PRN
  Administered 2022-09-13: 2 mg via INTRAVENOUS

## 2022-09-13 MED ORDER — POLYETHYLENE GLYCOL 3350 17 G PO PACK: 17.0000 g | PACK | Freq: Every day | ORAL | 0 refills | Status: DC

## 2022-09-13 MED ORDER — OXYCODONE HCL 5 MG PO TABS
5.0000 mg | ORAL_TABLET | ORAL | Status: DC | PRN
  Administered 2022-09-13 – 2022-09-14 (×3): 10 mg via ORAL
  Filled 2022-09-13 (×3): qty 2

## 2022-09-13 MED ORDER — ACETAMINOPHEN 500 MG PO TABS: 1000.0000 mg | ORAL_TABLET | Freq: Three times a day (TID) | ORAL | 0 refills | Status: AC | PRN

## 2022-09-13 MED ORDER — VALACYCLOVIR HCL 500 MG PO TABS
500.0000 mg | ORAL_TABLET | Freq: Every day | ORAL | Status: DC | PRN
  Filled 2022-09-13: qty 1

## 2022-09-13 MED ORDER — ROPIVACAINE HCL 5 MG/ML IJ SOLN
INTRAMUSCULAR | Status: DC | PRN
  Administered 2022-09-13: 25 mL via PERINEURAL

## 2022-09-13 MED ORDER — DIPHENHYDRAMINE HCL 12.5 MG/5ML PO ELIX: 12.5000 mg | ORAL_SOLUTION | ORAL | Status: DC | PRN

## 2022-09-13 SURGICAL SUPPLY — 64 items
ADH SKN CLS APL DERMABOND .7 (GAUZE/BANDAGES/DRESSINGS) ×1
APL PRP STRL LF DISP 70% ISPRP (MISCELLANEOUS) ×2
BAG COUNTER SPONGE SURGICOUNT (BAG) IMPLANT
BAG SPNG CNTER NS LX DISP (BAG)
BLADE SAG 18X100X1.27 (BLADE) ×1 IMPLANT
BLADE SAW SAG 35X64 .89 (BLADE) ×1 IMPLANT
BNDG CMPR 5X3 CHSV STRCH STRL (GAUZE/BANDAGES/DRESSINGS) ×1
BNDG CMPR MED 10X6 ELC LF (GAUZE/BANDAGES/DRESSINGS) ×1
BNDG COHESIVE 3X5 TAN ST LF (GAUZE/BANDAGES/DRESSINGS) ×1 IMPLANT
BNDG ELASTIC 6X10 VLCR STRL LF (GAUZE/BANDAGES/DRESSINGS) ×1 IMPLANT
BOWL SMART MIX CTS (DISPOSABLE) ×1 IMPLANT
BSPLAT TIB 5D C 16NT STM RT (Knees) ×1 IMPLANT
CEMENT BONE REFOBACIN R1X40 US (Cement) IMPLANT
CHLORAPREP W/TINT 26 (MISCELLANEOUS) ×2 IMPLANT
COMP FEM CMT CR PS 56 RT (Joint) ×1 IMPLANT
COMPONENT FEM CMT CR PS 56 RT (Joint) IMPLANT
COVER SURGICAL LIGHT HANDLE (MISCELLANEOUS) ×1 IMPLANT
CUFF TOURN SGL QUICK 34 (TOURNIQUET CUFF) ×1
CUFF TRNQT CYL 34X4.125X (TOURNIQUET CUFF) ×1 IMPLANT
DERMABOND ADVANCED .7 DNX12 (GAUZE/BANDAGES/DRESSINGS) ×1 IMPLANT
DRAPE INCISE IOBAN 85X60 (DRAPES) ×1 IMPLANT
DRAPE SHEET LG 3/4 BI-LAMINATE (DRAPES) ×1 IMPLANT
DRAPE U-SHAPE 47X51 STRL (DRAPES) ×1 IMPLANT
DRESSING AQUACEL AG SP 3.5X10 (GAUZE/BANDAGES/DRESSINGS) ×1 IMPLANT
DRSG AQUACEL AG SP 3.5X10 (GAUZE/BANDAGES/DRESSINGS) ×1
ELECT REM PT RETURN 15FT ADLT (MISCELLANEOUS) ×1 IMPLANT
GAUZE SPONGE 4X4 12PLY STRL (GAUZE/BANDAGES/DRESSINGS) ×1 IMPLANT
GLOVE BIO SURGEON STRL SZ 6.5 (GLOVE) ×2 IMPLANT
GLOVE BIOGEL PI IND STRL 6.5 (GLOVE) ×1 IMPLANT
GLOVE BIOGEL PI IND STRL 8 (GLOVE) ×1 IMPLANT
GLOVE SURG ORTHO 8.0 STRL STRW (GLOVE) ×2 IMPLANT
GOWN STRL REUS W/ TWL XL LVL3 (GOWN DISPOSABLE) ×2 IMPLANT
GOWN STRL REUS W/TWL XL LVL3 (GOWN DISPOSABLE) ×2
HANDPIECE INTERPULSE COAX TIP (DISPOSABLE) ×1
HOLDER FOLEY CATH W/STRAP (MISCELLANEOUS) ×1 IMPLANT
HOOD PEEL AWAY T7 (MISCELLANEOUS) ×3 IMPLANT
INSERT TIB AS PERS SZ CD 12 RT (Insert) IMPLANT
KIT TURNOVER KIT A (KITS) IMPLANT
MANIFOLD NEPTUNE II (INSTRUMENTS) ×1 IMPLANT
MARKER SKIN DUAL TIP RULER LAB (MISCELLANEOUS) ×1 IMPLANT
NS IRRIG 1000ML POUR BTL (IV SOLUTION) ×1 IMPLANT
PACK TOTAL KNEE CUSTOM (KITS) ×1 IMPLANT
PIN DRILL HDLS TROCAR 75 4PK (PIN) IMPLANT
SCREW HEADED 33MM KNEE (MISCELLANEOUS) IMPLANT
SET HNDPC FAN SPRY TIP SCT (DISPOSABLE) ×1 IMPLANT
SOLUTION IRRIG SURGIPHOR (IV SOLUTION) IMPLANT
SPIKE FLUID TRANSFER (MISCELLANEOUS) ×1 IMPLANT
STEM POLY PAT PLY 29M KNEE (Knees) IMPLANT
STEM TIBIA 5 DEG SZ C R KNEE (Knees) IMPLANT
STRIP CLOSURE SKIN 1/2X4 (GAUZE/BANDAGES/DRESSINGS) ×1 IMPLANT
SUT MNCRL AB 3-0 PS2 18 (SUTURE) ×1 IMPLANT
SUT STRATAFIX 0 PDS 27 VIOLET (SUTURE) ×1
SUT STRATAFIX PDO 1 14 VIOLET (SUTURE) ×1
SUT STRATFX PDO 1 14 VIOLET (SUTURE) ×1
SUT VIC AB 2-0 CT2 27 (SUTURE) ×2 IMPLANT
SUT VLOC 180 0 24IN GS25 (SUTURE) IMPLANT
SUTURE STRATFX 0 PDS 27 VIOLET (SUTURE) ×1 IMPLANT
SUTURE STRATFX PDO 1 14 VIOLET (SUTURE) ×1 IMPLANT
SYR 50ML LL SCALE MARK (SYRINGE) ×1 IMPLANT
TIBIA STEM 5 DEG SZ C R KNEE (Knees) ×1 IMPLANT
TRAY FOLEY MTR SLVR 14FR STAT (SET/KITS/TRAYS/PACK) IMPLANT
TUBE SUCTION HIGH CAP CLEAR NV (SUCTIONS) ×1 IMPLANT
UNDERPAD 30X36 HEAVY ABSORB (UNDERPADS AND DIAPERS) ×1 IMPLANT
WRAP KNEE MAXI GEL POST OP (GAUZE/BANDAGES/DRESSINGS) IMPLANT

## 2022-09-13 NOTE — Evaluation (Signed)
Physical Therapy Evaluation Patient Details Name: Latasha Shepard MRN: [id number] DOB: 01-18-1971 Today's Date: 09/13/2022  History of Present Illness  52 yo female presents to therapy s/p R TKA on 09/13/2022 due to failure of conservative measures. Pt PMH includes but is not limited to: HDL, HTN, herpes and L TKA on 05/22/2022.  Clinical Impression      Latasha Shepard is a 52 y.o. female POD 0 s/p R TKA. Patient reports IND with mobility at baseline. Patient is now limited by functional impairments (see PT problem list below) and requires S for bed mobility and min guard and cues for transfers. Patient was able to ambulate 45 feet with RW and min guard level of assist. Patient instructed in exercise to facilitate ROM and circulation to manage edema. Patient will benefit from continued skilled PT interventions to address impairments and progress towards PLOF. Acute PT will follow to progress mobility and stair training in preparation for safe discharge home with family support and OPPT services starting 7/15.     Assistance Recommended at Discharge Intermittent Supervision/Assistance  If plan is discharge home, recommend the following:  Can travel by private vehicle  A little help with walking and/or transfers;A little help with bathing/dressing/bathroom;Assistance with cooking/housework;Assist for transportation;Help with stairs or ramp for entrance        Equipment Recommendations None recommended by PT (DME in home setting)  Recommendations for Other Services       Functional Status Assessment Patient has had a recent decline in their functional status and demonstrates the ability to make significant improvements in function in a reasonable and predictable amount of time.     Precautions / Restrictions Precautions Precautions: Knee;Fall Restrictions Weight Bearing Restrictions: No      Mobility  Bed Mobility Overal bed mobility: Needs Assistance Bed Mobility: Supine to Sit      Supine to sit: Supervision, HOB elevated     General bed mobility comments: min cues    Transfers Overall transfer level: Needs assistance Equipment used: Rolling walker (2 wheels) Transfers: Sit to/from Stand Sit to Stand: Min guard           General transfer comment: min cues for hand placement and power up    Ambulation/Gait Ambulation/Gait assistance: Min guard Gait Distance (Feet): 45 Feet Assistive device: Rolling walker (2 wheels) Gait Pattern/deviations: Step-to pattern, Antalgic Gait velocity: decreased        Stairs            Wheelchair Mobility     Tilt Bed    Modified Rankin (Stroke Patients Only)       Balance Overall balance assessment: Needs assistance Sitting-balance support: Feet supported Sitting balance-Leahy Scale: Good     Standing balance support: Bilateral upper extremity supported, During functional activity, Reliant on assistive device for balance Standing balance-Leahy Scale: Poor                               Pertinent Vitals/Pain Pain Assessment Pain Assessment: 0-10 Pain Score: 3  Pain Location: R knee Pain Descriptors / Indicators: Burning, Aching, Discomfort, Operative site guarding Pain Intervention(s): Limited activity within patient's tolerance, Monitored during session, Premedicated before session, Repositioned, Ice applied    Home Living Family/patient expects to be discharged to:: Private residence Living Arrangements: Spouse/significant other Available Help at Discharge: Family;Available 24 hours/day Type of Home: House Home Access: Stairs to enter Entrance Stairs-Rails: Right Entrance Stairs-Number of Steps: 3   Home Layout:  Two level;Able to live on main level with bedroom/bathroom Home Equipment: BSC/3in1;Shower seat - built in;Rolling Environmental consultant (2 wheels) (iceman machine)      Prior Function Prior Level of Function : Independent/Modified Independent;Driving              Mobility Comments: IND with ADLs, self care tasks, IADLs       Hand Dominance        Extremity/Trunk Assessment        Lower Extremity Assessment Lower Extremity Assessment: RLE deficits/detail RLE Deficits / Details: ankle DF/PF 5/5; SLR < 10 degree lag RLE Sensation: decreased light touch (proximal lateral R LE)    Cervical / Trunk Assessment Cervical / Trunk Assessment: Normal  Communication   Communication: No difficulties  Cognition Arousal/Alertness: Awake/alert Behavior During Therapy: WFL for tasks assessed/performed Overall Cognitive Status: Within Functional Limits for tasks assessed                                          General Comments      Exercises Total Joint Exercises Ankle Circles/Pumps: AROM, Both, 20 reps Quad Sets: AROM, Right, 5 reps Heel Slides: AROM, Right, 5 reps   Assessment/Plan    PT Assessment Patient needs continued PT services  PT Problem List Decreased strength;Decreased range of motion;Decreased activity tolerance;Decreased balance;Decreased mobility;Decreased coordination;Pain       PT Treatment Interventions DME instruction;Gait training;Stair training;Functional mobility training;Therapeutic activities;Therapeutic exercise;Balance training;Neuromuscular re-education;Patient/family education;Modalities    PT Goals (Current goals can be found in the Care Plan section)  Acute Rehab PT Goals Patient Stated Goal: to be able to get back to normal walking PT Goal Formulation: With patient Time For Goal Achievement: 09/27/22 Potential to Achieve Goals: Good    Frequency 7X/week     Co-evaluation               AM-PAC PT "6 Clicks" Mobility  Outcome Measure Help needed turning from your back to your side while in a flat bed without using bedrails?: None Help needed moving from lying on your back to sitting on the side of a flat bed without using bedrails?: A Little Help needed moving to and from a bed  to a chair (including a wheelchair)?: A Little Help needed standing up from a chair using your arms (e.g., wheelchair or bedside chair)?: A Little Help needed to walk in hospital room?: A Little Help needed climbing 3-5 steps with a railing? : A Lot 6 Click Score: 18    End of Session Equipment Utilized During Treatment: Gait belt Activity Tolerance: Patient tolerated treatment well Patient left: in chair;with call bell/phone within reach;with family/visitor present Nurse Communication: Mobility status PT Visit Diagnosis: Unsteadiness on feet (R26.81);Other abnormalities of gait and mobility (R26.89);Muscle weakness (generalized) (M62.81);Difficulty in walking, not elsewhere classified (R26.2);Pain Pain - Right/Left: Right Pain - part of body: Knee;Leg    Time: 8413-2440 PT Time Calculation (min) (ACUTE ONLY): 26 min   Charges:   PT Evaluation $PT Eval Low Complexity: 1 Low PT Treatments $Gait Training: 8-22 mins PT General Charges $$ ACUTE PT VISIT: 1 Visit         Johnny Bridge, PT Acute Rehab   Jacqualyn Posey 09/13/2022, 5:25 PM

## 2022-09-13 NOTE — Discharge Instructions (Signed)
INSTRUCTIONS AFTER JOINT REPLACEMENT  ° °Remove items at home which could result in a fall. This includes throw rugs or furniture in walking pathways °ICE to the affected joint every three hours while awake for 30 minutes at a time, for at least the first 3-5 days, and then as needed for pain and swelling.  Continue to use ice for pain and swelling. You may notice swelling that will progress down to the foot and ankle.  This is normal after surgery.  Elevate your leg when you are not up walking on it.   °Continue to use the breathing machine you got in the hospital (incentive spirometer) which will help keep your temperature down.  It is common for your temperature to cycle up and down following surgery, especially at night when you are not up moving around and exerting yourself.  The breathing machine keeps your lungs expanded and your temperature down. ° ° °DIET:  As you were doing prior to hospitalization, we recommend a well-balanced diet. ° °DRESSING / WOUND CARE / SHOWERING ° °Keep the surgical dressing until follow up.  The dressing is water proof, so you can shower without any extra covering.  IF THE DRESSING FALLS OFF or the wound gets wet inside, change the dressing with sterile gauze.  Please use good hand washing techniques before changing the dressing.  Do not use any lotions or creams on the incision until instructed by your surgeon.   ° °ACTIVITY ° °Increase activity slowly as tolerated, but follow the weight bearing instructions below.   °No driving for 6 weeks or until further direction given by your physician.  You cannot drive while taking narcotics.  °No lifting or carrying greater than 10 lbs. until further directed by your surgeon. °Avoid periods of inactivity such as sitting longer than an hour when not asleep. This helps prevent blood clots.  °You may return to work once you are authorized by your doctor.  ° ° ° °WEIGHT BEARING  ° °Weight bearing as tolerated with assist device (walker, cane,  etc) as directed, use it as long as suggested by your surgeon or therapist, typically at least 4-6 weeks. ° ° °EXERCISES ° °Results after joint replacement surgery are often greatly improved when you follow the exercise, range of motion and muscle strengthening exercises prescribed by your doctor. Safety measures are also important to protect the joint from further injury. Any time any of these exercises cause you to have increased pain or swelling, decrease what you are doing until you are comfortable again and then slowly increase them. If you have problems or questions, call your caregiver or physical therapist for advice.  ° °Rehabilitation is important following a joint replacement. After just a few days of immobilization, the muscles of the leg can become weakened and shrink (atrophy).  These exercises are designed to build up the tone and strength of the thigh and leg muscles and to improve motion. Often times heat used for twenty to thirty minutes before working out will loosen up your tissues and help with improving the range of motion but do not use heat for the first two weeks following surgery (sometimes heat can increase post-operative swelling).  ° °These exercises can be done on a training (exercise) mat, on the floor, on a table or on a bed. Use whatever works the best and is most comfortable for you.    Use music or television while you are exercising so that the exercises are a pleasant break in your   day. This will make your life better with the exercises acting as a break in your routine that you can look forward to.   Perform all exercises about fifteen times, three times per day or as directed.  You should exercise both the operative leg and the other leg as well. ° °Exercises include: °  °Quad Sets - Tighten up the muscle on the front of the thigh (Quad) and hold for 5-10 seconds.   °Straight Leg Raises - With your knee straight (if you were given a brace, keep it on), lift the leg to 60  degrees, hold for 3 seconds, and slowly lower the leg.  Perform this exercise against resistance later as your leg gets stronger.  °Leg Slides: Lying on your back, slowly slide your foot toward your buttocks, bending your knee up off the floor (only go as far as is comfortable). Then slowly slide your foot back down until your leg is flat on the floor again.  °Angel Wings: Lying on your back spread your legs to the side as far apart as you can without causing discomfort.  °Hamstring Strength:  Lying on your back, push your heel against the floor with your leg straight by tightening up the muscles of your buttocks.  Repeat, but this time bend your knee to a comfortable angle, and push your heel against the floor.  You may put a pillow under the heel to make it more comfortable if necessary.  ° °A rehabilitation program following joint replacement surgery can speed recovery and prevent re-injury in the future due to weakened muscles. Contact your doctor or a physical therapist for more information on knee rehabilitation.  ° ° °CONSTIPATION ° °Constipation is defined medically as fewer than three stools per week and severe constipation as less than one stool per week.  Even if you have a regular bowel pattern at home, your normal regimen is likely to be disrupted due to multiple reasons following surgery.  Combination of anesthesia, postoperative narcotics, change in appetite and fluid intake all can affect your bowels.  ° °YOU MUST use at least one of the following options; they are listed in order of increasing strength to get the job done.  They are all available over the counter, and you may need to use some, POSSIBLY even all of these options:   ° °Drink plenty of fluids (prune juice may be helpful) and high fiber foods °Colace 100 mg by mouth twice a day  °Senokot for constipation as directed and as needed Dulcolax (bisacodyl), take with full glass of water  °Miralax (polyethylene glycol) once or twice a day as  needed. ° °If you have tried all these things and are unable to have a bowel movement in the first 3-4 days after surgery call either your surgeon or your primary doctor.   ° °If you experience loose stools or diarrhea, hold the medications until you stool forms back up.  If your symptoms do not get better within 1 week or if they get worse, check with your doctor.  If you experience "the worst abdominal pain ever" or develop nausea or vomiting, please contact the office immediately for further recommendations for treatment. ° ° °ITCHING:  If you experience itching with your medications, try taking only a single pain pill, or even half a pain pill at a time.  You can also use Benadryl over the counter for itching or also to help with sleep.  ° °TED HOSE STOCKINGS:  Use stockings on both   legs until for at least 2 weeks or as directed by physician office. They may be removed at night for sleeping. ° °MEDICATIONS:  See your medication summary on the “After Visit Summary” that nursing will review with you.  You may have some home medications which will be placed on hold until you complete the course of blood thinner medication.  It is important for you to complete the blood thinner medication as prescribed. ° ° °Blood clot prevention (DVT Prophylaxis): After surgery you are at an increased risk for a blood clot. you were prescribed a blood thinner, Aspirin 81mg, to be taken twice daily for a total of 4 weeks from surgery to help reduce your risk of getting a blood clot. This will help prevent a blood clot. Signs of a pulmonary embolus (blood clot in the lungs) include sudden short of breath, feeling lightheaded or dizzy, chest pain with a deep breath, rapid pulse rapid breathing. Signs of a blood clot in your arms or legs include new unexplained swelling and cramping, warm, red or darkened skin around the painful area. Please call the office or 911 right away if these signs or symptoms develop. ° °PRECAUTIONS:  If you  experience chest pain or shortness of breath - call 911 immediately for transfer to the hospital emergency department.  ° °If you develop a fever greater that 101 F, purulent drainage from wound, increased redness or drainage from wound, foul odor from the wound/dressing, or calf pain - CONTACT YOUR SURGEON.   °                                                °FOLLOW-UP APPOINTMENTS:  If you do not already have a post-op appointment, please call the office for an appointment to be seen by your surgeon.  Guidelines for how soon to be seen are listed in your “After Visit Summary”, but are typically between 2-3 weeks after surgery. ° °OTHER INSTRUCTIONS:  ° °Knee Replacement:  Do not place pillow under knee, focus on keeping the knee straight while resting. CPM instructions: 0-90 degrees, 2 hours in the morning, 2 hours in the afternoon, and 2 hours in the evening. Place foam block, curve side up under heel at all times except when in CPM or when walking.  DO NOT modify, tear, cut, or change the foam block in any way. ° °POST-OPERATIVE OPIOID TAPER INSTRUCTIONS: °It is important to wean off of your opioid medication as soon as possible. If you do not need pain medication after your surgery it is ok to stop day one. °Opioids include: °Codeine, Hydrocodone(Norco, Vicodin), Oxycodone(Percocet, oxycontin) and hydromorphone amongst others.  °Long term and even short term use of opiods can cause: °Increased pain response °Dependence °Constipation °Depression °Respiratory depression °And more.  °Withdrawal symptoms can include °Flu like symptoms °Nausea, vomiting °And more °Techniques to manage these symptoms °Hydrate well °Eat regular healthy meals °Stay active °Use relaxation techniques(deep breathing, meditating, yoga) °Do Not substitute Alcohol to help with tapering °If you have been on opioids for less than two weeks and do not have pain than it is ok to stop all together.  °Plan to wean off of opioids °This plan should  start within one week post op of your joint replacement. °Maintain the same interval or time between taking each dose and first decrease the dose.  °Cut the   total daily intake of opioids by one tablet each day °Next start to increase the time between doses. °The last dose that should be eliminated is the evening dose.  ° °MAKE SURE YOU:  °Understand these instructions.  °Get help right away if you are not doing well or get worse.  ° ° °Thank you for letting us be a part of your medical care team.  It is a privilege we respect greatly.  We hope these instructions will help you stay on track for a fast and full recovery!  ° ° ° °  °  °

## 2022-09-13 NOTE — Anesthesia Procedure Notes (Signed)
Anesthesia Regional Block: Adductor canal block   Pre-Anesthetic Checklist: , timeout performed,  Correct Patient, Correct Site, Correct Laterality,  Correct Procedure, Correct Position, site marked,  Risks and benefits discussed,  Surgical consent,  Pre-op evaluation,  At surgeon's request and post-op pain management  Laterality: Right  Prep: chloraprep       Needles:  Injection technique: Single-shot  Needle Type: Echogenic Needle     Needle Length: 9cm  Needle Gauge: 21     Additional Needles:   Narrative:  Start time: 09/13/2022 8:05 AM End time: 09/13/2022 8:12 AM Injection made incrementally with aspirations every 5 mL.  Performed by: Personally  Anesthesiologist: Achille Rich, MD  Additional Notes: Pt tolerated the procedure well.

## 2022-09-13 NOTE — Anesthesia Preprocedure Evaluation (Signed)
Anesthesia Evaluation  Patient identified by MRN, date of birth, ID band Patient awake    Reviewed: Allergy & Precautions, H&P , NPO status , Patient's Chart, lab work & pertinent test results  Airway Mallampati: II   Neck ROM: full    Dental   Pulmonary former smoker   breath sounds clear to auscultation       Cardiovascular hypertension,  Rhythm:regular Rate:Normal     Neuro/Psych   Anxiety        GI/Hepatic ,GERD  ,,  Endo/Other  diabetes, Type 2    Renal/GU      Musculoskeletal  (+) Arthritis ,    Abdominal   Peds  Hematology   Anesthesia Other Findings   Reproductive/Obstetrics                             Anesthesia Physical Anesthesia Plan  ASA: 2  Anesthesia Plan: MAC and Spinal   Post-op Pain Management: Regional block*   Induction: Intravenous  PONV Risk Score and Plan: 2 and Propofol infusion, Midazolam, Ondansetron and Treatment may vary due to age or medical condition  Airway Management Planned: Simple Face Mask  Additional Equipment:   Intra-op Plan:   Post-operative Plan:   Informed Consent: I have reviewed the patients History and Physical, chart, labs and discussed the procedure including the risks, benefits and alternatives for the proposed anesthesia with the patient or authorized representative who has indicated his/her understanding and acceptance.     Dental advisory given  Plan Discussed with: CRNA, Anesthesiologist and Surgeon  Anesthesia Plan Comments:        Anesthesia Quick Evaluation

## 2022-09-13 NOTE — Interval H&P Note (Signed)

## 2022-09-13 NOTE — Op Note (Signed)
DATE OF SURGERY:  09/13/2022 TIME: 10:21 AM  PATIENT NAME:  Latasha Shepard   AGE: 52 y.o.    PRE-OPERATIVE DIAGNOSIS: End-stage right knee osteoarthritis  POST-OPERATIVE DIAGNOSIS:  Same  PROCEDURE:  Right Total Knee Arthroplasty  SURGEON:  Rayah Fines A Malcomb Gangemi, MD   ASSISTANT: Kathie Dike, PA-C, present and scrubbed throughout the case, critical for assistance with exposure, retraction, instrumentation, and closure.   OPERATIVE IMPLANTS:  Cemented Zimmer persona size right narrow femur, right C tibial baseplate, 12 mm MC polyethylene insert, 29 mm cemented poly patella Implant Name Type Inv. Item Serial No. Manufacturer Lot No. LRB No. Used Action  CEMENT BONE REFOBACIN R1X40 [place] - [id number] Cement CEMENT BONE REFOBACIN R1X40 [place]  ZIMMER RECON(ORTH,TRAU,BIO,SG) [id number] Right 1 Implanted  CEMENT BONE REFOBACIN R1X40 [place] - [id number] Cement CEMENT BONE REFOBACIN R1X40 [place]  ZIMMER RECON(ORTH,TRAU,BIO,SG) [id number] Right 1 Implanted  BSPLAT TIB 5D C 16NT STM RT - [id number] Knees BSPLAT TIB 5D C 16NT STM RT  ZIMMER RECON(ORTH,TRAU,BIO,SG) [id number] Right 1 Implanted  COMP FEM CMT CR PS 56 RT - [id number] Joint COMP FEM CMT CR PS 56 RT  ZIMMER RECON(ORTH,TRAU,BIO,SG) [id number] Right 1 Implanted  STEM POLY PAT PLY 38M KNEE - [id number] Knees STEM POLY PAT PLY 38M KNEE  ZIMMER RECON(ORTH,TRAU,BIO,SG) [id number] Right 1 Implanted  INSERT TIB AS PERS SZ CD 12 RT - [id number] Insert INSERT TIB AS PERS SZ CD 12 RT  ZIMMER RECON(ORTH,TRAU,BIO,SG) [id number] Right 1 Implanted      PREOPERATIVE INDICATIONS:  Shabreka Coulon is a 52 y.o. year old female with end stage bone on bone degenerative arthritis of the knee who failed conservative treatment, including injections, antiinflammatories, activity modification, and assistive devices, and had significant impairment of their activities of daily living, and elected for Total Knee Arthroplasty.   The risks, benefits, and alternatives were  discussed at length including but not limited to the risks of infection, bleeding, nerve injury, stiffness, blood clots, the need for revision surgery, cardiopulmonary complications, among others, and they were willing to proceed.   ESTIMATED BLOOD LOSS: 25cc  OPERATIVE DESCRIPTION:   Once adequate anesthesia was induced, preoperative antibiotics, 2 gm of ancef,1 gm of Tranexamic Acid, and 8 mg of Decadron administered, the patient was positioned supine with a right thigh tourniquet placed.  The right lower extremity was prepped and draped in sterile fashion.  A time-  out was performed identifying the patient, planned procedure, and the appropriate extremity.     The leg was  exsanguinated, tourniquet elevated to 250 mmHg.  A midline incision was  made followed by median parapatellar arthrotomy. Anterior horn of the medial meniscus was released and resected. A medial release was performed, the infrapatellar fat pad was resected with care taken to protect the patellar tendon. The suprapatellar fat was removed to exposed the distal anterior femur. The anterior horn of the lateral meniscus and ACL were released.    Following initial  exposure, I first started with the femur  The femoral  canal was opened with a drill, canal was suctioned to try to prevent fat emboli.  An  intramedullary rod was passed set at 6 degrees valgus, 10 mm. The distal femur was resected.  Following this resection, the tibia was  subluxated anteriorly.  Using the extramedullary guide, 10 mm of bone was resected off   the proximal lateral tibia.  We confirmed the gap would be  stable medially and laterally with a size 10mm spacer block as well as confirmed that the tibial  cut was perpendicular in the coronal plane, checking with an alignment rod.    Once this was done, the posterior femoral referencing femoral sizer was placed under to the posterior condyles with 3 degrees of external rotational which was parallel to the  transepicondylar axis and perpendicular to [employer]. The femur was sized to be a size 5 in the anterior-  posterior dimension. The  anterior, posterior, and  chamfer cuts were made without difficulty nor   notching making certain that I was along the anterior cortex to help  with flexion gap stability. Next a laminar spreader was placed with the knee in flexion and the medial lateral menisci were resected.  5 cc of the Exparel mixture was injected in the medial side of the back of the knee and 3 cc in the lateral side.  1/2 inch curved osteotome was used to resect posterior osteophyte that was then removed with a pituitary rongeur.       At this point, the tibia was sized to be a size C.  The size C tray was  then pinned in position. Trial reduction was now carried with a 5 femur, C tibia, a 10 mm MC insert.  Laxity in both flexion and extension. Upsized to a 12mm poly. The knee had full extension and was stable to varus valgus stress in extension.  The knee was stable in flexion and the PCL was left intact.   Attention was next directed to the patella.  Precut  measurement was noted to be 20 mm.  I resected down to 13 mm and used a  29mm patellar button to restore patellar height as well as cover the cut surface.     The patella lug holes were drilled and a 29mm patella poly trial was placed.    The knee was brought to full extension with good flexion stability with the patella tracking through the trochlea without application of pressure.     Next the femoral component was again assessed and determined to be seated and appropriately lateralized.  The femoral lug holes were drilled.  The femoral component was then removed. Tibial component was again assessed and felt to be seated and appropriately rotated with the medial third of the tubercle. The tibia was then drilled, and keel punched.     Final components were  opened and antibiotic cement was mixed.      Final implants were then   cemented onto cleaned and dried cut surfaces of bone with the knee brought to extension with a 12mm MC poly.  The knee was irrigated with sterile Betadine diluted in saline as well as pulse lavage normal saline.  The synovial lining was  then injected a dilute Exparel with 30cc of 0.25% marcaine with epinephrine.         Once the cement had fully cured, excess cement was removed throughout the knee.  I confirmed that I was satisfied with the range of motion and stability, and the final 12mm MC poly insert was chosen.  It was placed into the knee.         The tourniquet had been let down.  No significant hemostasis was required.  The medial parapatellar arthrotomy was then reapproximated using and #1 Stratafix sutures with the knee  in flexion.  The remaining wound was closed with 0 v-lock, 2-0 Vicryl, and running 3-0 Monocryl. The knee was cleaned, dried, dressed sterilely using Dermabond and   Aquacel dressing.  The patient was then brought to  recovery room in stable condition, tolerating the procedure  well. There were no complications.   Post op recs: WB: WBAT Abx: ancef Imaging: PACU xrays DVT prophylaxis: Aspirin 81mg  BID x4 weeks Follow up: 2 weeks after surgery for a wound check with Dr. Blanchie Dessert at [facility].  Address: [address], [place], [place] [address]  Office Phone: [phone]  Weber Cooks, MD Orthopaedic Surgery

## 2022-09-13 NOTE — Anesthesia Procedure Notes (Signed)
Procedure Name: MAC Date/Time: 09/13/2022 8:36 AM  Performed by: Vanessa Gilberts, CRNAPre-anesthesia Checklist: Patient identified, Emergency Drugs available, Suction available and Patient being monitored Patient Re-evaluated:Patient Re-evaluated prior to induction Oxygen Delivery Method: Simple face mask

## 2022-09-13 NOTE — Anesthesia Procedure Notes (Signed)
Spinal  Patient location during procedure: OR Start time: 09/13/2022 8:38 AM End time: 09/13/2022 8:40 AM Reason for block: surgical anesthesia Staffing Performed: anesthesiologist  Anesthesiologist: Achille Rich, MD Performed by: Achille Rich, MD Authorized by: Achille Rich, MD   Preanesthetic Checklist Completed: patient identified, IV checked, risks and benefits discussed, surgical consent, monitors and equipment checked, pre-op evaluation and timeout performed Spinal Block Patient position: sitting Prep: DuraPrep Patient monitoring: cardiac monitor, continuous pulse ox and blood pressure Approach: midline Location: L3-4 Injection technique: single-shot Needle Needle type: Pencan  Needle gauge: 24 G Needle length: 9 cm Assessment Sensory level: T10 Events: CSF return Additional Notes Functioning IV was confirmed and monitors were applied. Sterile prep and drape, including hand hygiene and sterile gloves were used. The patient was positioned and the spine was prepped. The skin was anesthetized with lidocaine.  Free flow of clear CSF was obtained prior to injecting local anesthetic into the CSF.  The spinal needle aspirated freely following injection.  The needle was carefully withdrawn.  The patient tolerated the procedure well.

## 2022-09-13 NOTE — Transfer of Care (Signed)
Immediate Anesthesia Transfer of Care Note  Patient: Latasha Shepard  Procedure(s) Performed: TOTAL KNEE ARTHROPLASTY (Right: Knee)  Patient Location: PACU  Anesthesia Type:Spinal  Level of Consciousness: awake and patient cooperative  Airway & Oxygen Therapy: Patient Spontanous Breathing and Patient connected to face mask  Post-op Assessment: Report given to RN and Post -op Vital signs reviewed and stable  Post vital signs: Reviewed and stable  Last Vitals:  Vitals Value Taken Time  BP 108/66 09/13/22 1100  Temp    Pulse 65 09/13/22 1100  Resp 14 09/13/22 1100  SpO2 96 % 09/13/22 1100  Vitals shown include unvalidated device data.  Last Pain:  Vitals:   09/13/22 0708  TempSrc:   PainSc: 0-No pain         Complications: No notable events documented.

## 2022-09-13 NOTE — Progress Notes (Signed)
Orthopedic Tech Progress Note Patient Details:  Latasha Shepard 02-28-1971 [id number]  Ortho Devices Type of Ortho Device: Bone foam zero knee Ortho Device/Splint Location: right Ortho Device/Splint Interventions: Ordered, Application, Adjustment   Post Interventions Patient Tolerated: Well Instructions Provided: Adjustment of device, Care of device  Kizzie Fantasia 09/13/2022, 11:34 AM

## 2022-09-14 ENCOUNTER — Encounter (HOSPITAL_COMMUNITY): Payer: Self-pay | Admitting: Orthopedic Surgery

## 2022-09-14 DIAGNOSIS — M1711 Unilateral primary osteoarthritis, right knee: Secondary | ICD-10-CM | POA: Diagnosis not present

## 2022-09-14 LAB — BASIC METABOLIC PANEL
Anion gap: 8 (ref 5–15)
BUN: 17 mg/dL (ref 6–20)
CO2: 26 mmol/L (ref 22–32)
Calcium: 8.6 mg/dL — ABNORMAL LOW (ref 8.9–10.3)
Chloride: 102 mmol/L (ref 98–111)
Creatinine, Ser: 0.61 mg/dL (ref 0.44–1.00)
GFR, Estimated: >60 mL/min (ref >60)
Glucose, Bld: 130 mg/dL — ABNORMAL HIGH (ref 70–99)
Potassium: 4.1 mmol/L (ref 3.5–5.1)
Sodium: 136 mmol/L (ref 135–145)

## 2022-09-14 LAB — CBC
HCT: 30.5 % — ABNORMAL LOW (ref 36.0–46.0)
Hemoglobin: 10 g/dL — ABNORMAL LOW (ref 12.0–15.0)
MCH: 29.4 pg (ref 26.0–34.0)
MCHC: 32.8 g/dL (ref 30.0–36.0)
MCV: 89.7 fL (ref 80.0–100.0)
Platelets: 243 10*3/uL (ref 150–400)
RBC: 3.4 MIL/uL — ABNORMAL LOW (ref 3.87–5.11)
RDW: 13.2 % (ref 11.5–15.5)
WBC: 13.2 10*3/uL — ABNORMAL HIGH (ref 4.0–10.5)
nRBC: 0 % (ref 0.0–0.2)

## 2022-09-14 NOTE — Plan of Care (Signed)
Problem: Education: Goal: Knowledge of the prescribed therapeutic regimen will improve Outcome: Progressing   Problem: Clinical Measurements: Goal: Postoperative complications will be avoided or minimized Outcome: Progressing   Problem: Pain Management: Goal: Pain level will decrease with appropriate interventions Outcome: Progressing   Problem: Activity: Goal: Risk for activity intolerance will decrease Outcome: Progressing   Haydee Salter, RN 09/14/22 12:34 PM

## 2022-09-14 NOTE — Anesthesia Postprocedure Evaluation (Signed)
Anesthesia Post Note  Patient: Latasha Shepard  Procedure(s) Performed: TOTAL KNEE ARTHROPLASTY (Right: Knee)     Patient location during evaluation: PACU Anesthesia Type: MAC and Spinal Level of consciousness: oriented and awake and alert Pain management: pain level controlled Vital Signs Assessment: post-procedure vital signs reviewed and stable Respiratory status: spontaneous breathing, respiratory function stable and patient connected to nasal cannula oxygen Cardiovascular status: blood pressure returned to baseline and stable Postop Assessment: no headache, no backache and no apparent nausea or vomiting Anesthetic complications: no   No notable events documented.  Last Vitals:  Vitals:   09/14/22 0438 09/14/22 0952  BP: 106/67 97/60  Pulse: 61 61  Resp: 18 14  Temp: 36.8 C 36.7 C  SpO2: 99% 100%    Last Pain:  Vitals:   09/14/22 1317  TempSrc:   PainSc: 2                  Stana Bayon S

## 2022-09-14 NOTE — Plan of Care (Signed)
Patient discharging home via private vehicle with husband. Haydee Salter, RN 09/14/22 1:17 PM

## 2022-09-14 NOTE — TOC Transition Note (Signed)
Transition of Care [facility]) - CM/SW Discharge Note   Patient Details  Name: Latasha Shepard MRN: [id number] Date of Birth: [birth year]  [facility]) CM/SW Contact:  Amada Jupiter, LCSW Phone Number: 09/14/2022, 9:51 AM   Clinical Narrative:     Met with pt who confirms she has needed DME in the home.  OPPT already arranged with [facility].  No further TOC needs.  Final next level of care: OP Rehab Barriers to Discharge: No Barriers Identified   Patient Goals and CMS Choice      Discharge Placement                         Discharge Plan and Services Additional resources added to the After Visit Summary for                  DME Arranged: N/A DME Agency: NA                  Social Determinants of Health (SDOH) Interventions SDOH Screenings   Food Insecurity: No Food Insecurity (09/13/2022)  Housing: Low Risk  (09/13/2022)  Transportation Needs: No Transportation Needs (09/13/2022)  Utilities: Not At Risk (09/13/2022)  Tobacco Use: Medium Risk (09/13/2022)     Readmission Risk Interventions     No data to display

## 2022-09-14 NOTE — Progress Notes (Signed)
     Subjective:  Patient reports pain as mild. Very pleased with progress so far. Did great with PT today. Eager to go home today.  Objective:   VITALS:   Vitals:   09/13/22 1743 09/13/22 2136 09/14/22 0123 09/14/22 0438  BP: 135/75 116/70 107/68 106/67  Pulse: 70 64 67 61  Resp: 16 17 17 18   Temp: 97.8 F (36.6 C) 97.6 F (36.4 C) 97.7 F (36.5 C) 98.3 F (36.8 C)  TempSrc: Oral Oral Oral Oral  SpO2: 97% 98% 97% 99%  Weight:      Height:        Sensation intact distally Intact pulses distally Dorsiflexion/Plantar flexion intact Incision: dressing C/D/I Compartment soft    Lab Results  Component Value Date   WBC 13.2 (H) 09/14/2022   HGB 10.0 (L) 09/14/2022   HCT 30.5 (L) 09/14/2022   MCV 89.7 09/14/2022   PLT 243 09/14/2022   BMET    Component Value Date/Time   NA 136 09/14/2022 0347   K 4.1 09/14/2022 0347   CL 102 09/14/2022 0347   CO2 26 09/14/2022 0347   GLUCOSE 130 (H) 09/14/2022 0347   BUN 17 09/14/2022 0347   CREATININE 0.61 09/14/2022 0347   CALCIUM 8.6 (L) 09/14/2022 0347   GFRNONAA >60 09/14/2022 0347    Xray: TKA components in good position no adverse features  Assessment/Plan: 1 Day Post-Op   Principal Problem:   Primary osteoarthritis of right knee  S/p R TKA 09/13/22  Post op recs: WB: WBAT Abx: ancef Imaging: PACU xrays DVT prophylaxis: Aspirin 81mg  BID x4 weeks Follow up: 2 weeks after surgery for a wound check with Dr. Blan[facility] Dessert at [facility].  Address: [address], [place], [place] [address]  Office Phone: [phone]    Joen Laura 09/14/2022, 6:49 AM   Weber Cooks, MD  Contact information:   [phone] 7am-5pm epic message Dr. Blanchie Dessert, or call office for patient follow up: [phone] After hours and holidays please check [email or web address] for group call information for [facility]

## 2022-09-14 NOTE — Discharge Summary (Signed)
Physician Discharge Summary  Patient ID: Latasha Shepard MRN: [id number] DOB/AGE: Mar 26, 1970 52 y.o.  Admit date: 09/13/2022 Discharge date: 09/14/2022  Admission Diagnoses:  Primary osteoarthritis of right knee  Discharge Diagnoses:  Principal Problem:   Primary osteoarthritis of right knee   Past Medical History:  Diagnosis Date   Anxiety    Arthritis    Complication of anesthesia    Patient is a red-head and it takes more anesthesia. She has woke up during surgery. Spinal didn't take well in the past.   Diabetes mellitus without complication (HCC)    GERD (gastroesophageal reflux disease)    Herpes    Hypercholesteremia    Hypertension     Surgeries: Procedure(s): TOTAL KNEE ARTHROPLASTY on 09/13/2022   Consultants (if any):   Discharged Condition: Improved  Hospital Course: Latasha Shepard is an 52 y.o. female who was admitted 09/13/2022 with a diagnosis of Primary osteoarthritis of right knee and went to the operating room on 09/13/2022 and underwent the above named procedures.    She was given perioperative antibiotics:  Anti-infectives (From admission, onward)    Start     Dose/Rate Route Frequency Ordered Stop   09/14/22 1000  valACYclovir (VALTREX) tablet 500 mg        500 mg Oral Daily PRN 09/13/22 1203     09/13/22 1500  ceFAZolin (ANCEF) IVPB 2g/100 mL premix        2 g 200 mL/hr over 30 Minutes Intravenous Every 6 hours 09/13/22 1203 09/13/22 2253   09/13/22 0645  ceFAZolin (ANCEF) IVPB 2g/100 mL premix        2 g 200 mL/hr over 30 Minutes Intravenous On call to O.R. 09/13/22 7829 09/13/22 0850     .  She was given sequential compression devices, early ambulation, and aspirin for DVT prophylaxis.  She benefited maximally from the hospital stay and there were no complications.    Recent vital signs:  Vitals:   09/14/22 0123 09/14/22 0438  BP: 107/68 106/67  Pulse: 67 61  Resp: 17 18  Temp: 97.7 F (36.5 C) 98.3 F (36.8 C)  SpO2: 97% 99%     Recent laboratory studies:  Lab Results  Component Value Date   HGB 10.0 (L) 09/14/2022   HGB 12.8 08/31/2022   HGB 11.1 (L) 05/23/2022   Lab Results  Component Value Date   WBC 13.2 (H) 09/14/2022   PLT 243 09/14/2022   Lab Results  Component Value Date   INR 1.06 11/02/2010   Lab Results  Component Value Date   NA 136 09/14/2022   K 4.1 09/14/2022   CL 102 09/14/2022   CO2 26 09/14/2022   BUN 17 09/14/2022   CREATININE 0.61 09/14/2022   GLUCOSE 130 (H) 09/14/2022    Discharge Medications:   Allergies as of 09/14/2022   No Known Allergies      Medication List     STOP taking these medications    traMADol 50 MG tablet Commonly known as: ULTRAM       TAKE these medications    acetaminophen 500 MG tablet Commonly known as: TYLENOL Take 2 tablets (1,000 mg total) by mouth every 8 (eight) hours as needed.   amLODipine 10 MG tablet Commonly known as: NORVASC Take 10 mg by mouth at bedtime.   aspirin EC 81 MG tablet Take 1 tablet (81 mg total) by mouth 2 (two) times daily for 28 days. Swallow whole.   atorvastatin 80 MG tablet Commonly known as: LIPITOR Take 80  mg by mouth at bedtime.   celecoxib 100 MG capsule Commonly known as: CeleBREX Take 1 capsule (100 mg total) by mouth 2 (two) times daily for 14 days.   citalopram 10 MG tablet Commonly known as: CELEXA Take 10 mg by mouth at bedtime.   iVIZIA Dry Eyes 0.5 % Soln Generic drug: Povidone (PF) Place 1 drop into both eyes 2 (two) times daily.   lisinopril 10 MG tablet Commonly known as: ZESTRIL Take 10 mg by mouth at bedtime.   metFORMIN 500 MG tablet Commonly known as: GLUCOPHAGE Take 500 mg by mouth 2 (two) times daily.   methocarbamol 500 MG tablet Commonly known as: ROBAXIN Take 1 tablet (500 mg total) by mouth every 8 (eight) hours as needed for up to 10 days for muscle spasms.   omeprazole 20 MG capsule Commonly known as: PRILOSEC Take 20 mg by mouth at bedtime.    ondansetron 4 MG tablet Commonly known as: Zofran Take 1 tablet (4 mg total) by mouth every 8 (eight) hours as needed for up to 14 days for nausea or vomiting.   oxyCODONE 5 MG immediate release tablet Commonly known as: Roxicodone Take 1 tablet (5 mg total) by mouth every 4 (four) hours as needed for up to 7 days for severe pain or moderate pain.   polyethylene glycol 17 g packet Commonly known as: MiraLax Take 17 g by mouth daily.   valACYclovir 1000 MG tablet Commonly known as: VALTREX Take 500 mg by mouth daily as needed (Cold sore).        Diagnostic Studies: DG Knee Right Port  Result Date: 09/13/2022 CLINICAL DATA:  Postop. EXAM: PORTABLE RIGHT KNEE - 1-2 VIEW COMPARISON:  None Available. FINDINGS: Right knee arthroplasty in expected alignment. No periprosthetic lucency or fracture. There has been patellar resurfacing. Recent postsurgical change includes air and edema in the soft tissues and joint space. IMPRESSION: Right knee arthroplasty without immediate postoperative complication. Electronically Signed   By: Narda Rutherford M.D.   On: 09/13/2022 11:54    Disposition: Discharge disposition: 01-Home or Self Care       Discharge Instructions     Call MD / Call 911   Complete by: As directed    If you experience chest pain or shortness of breath, CALL 911 and be transported to the hospital emergency room.  If you develope a fever above 101 F, pus (white drainage) or increased drainage or redness at the wound, or calf pain, call your surgeon's office.   Constipation Prevention   Complete by: As directed    Drink plenty of fluids.  Prune juice may be helpful.  You may use a stool softener, such as Colace (over the counter) 100 mg twice a day.  Use MiraLax (over the counter) for constipation as needed.   Diet - low sodium heart healthy   Complete by: As directed    Increase activity slowly as tolerated   Complete by: As directed    Post-operative opioid taper  instructions:   Complete by: As directed    POST-OPERATIVE OPIOID TAPER INSTRUCTIONS: It is important to wean off of your opioid medication as soon as possible. If you do not need pain medication after your surgery it is ok to stop day one. Opioids include: Codeine, Hydrocodone(Norco, Vicodin), Oxycodone(Percocet, oxycontin) and hydromorphone amongst others.  Long term and even short term use of opiods can cause: Increased pain response Dependence Constipation Depression Respiratory depression And more.  Withdrawal symptoms can include Flu like  symptoms Nausea, vomiting And more Techniques to manage these symptoms Hydrate well Eat regular healthy meals Stay active Use relaxation techniques(deep breathing, meditating, yoga) Do Not substitute Alcohol to help with tapering If you have been on opioids for less than two weeks and do not have pain than it is ok to stop all together.  Plan to wean off of opioids This plan should start within one week post op of your joint replacement. Maintain the same interval or time between taking each dose and first decrease the dose.  Cut the total daily intake of opioids by one tablet each day Next start to increase the time between doses. The last dose that should be eliminated is the evening dose.           Follow-up Information     Joen Laura, MD Follow up in 2 week(s).   Specialty: Orthopedic Surgery Contact information: [address] [place] [address] [phone]                    Discharge Instructions      INSTRUCTIONS AFTER JOINT REPLACEMENT   Remove items at home which could result in a fall. This includes throw rugs or furniture in walking pathways ICE to the affected joint every three hours while awake for 30 minutes at a time, for at least the first 3-5 days, and then as needed for pain and swelling.  Continue to use ice for pain and swelling. You may notice swelling that will  progress down to the foot and ankle.  This is normal after surgery.  Elevate your leg when you are not up walking on it.   Continue to use the breathing machine you got in the hospital (incentive spirometer) which will help keep your temperature down.  It is common for your temperature to cycle up and down following surgery, especially at night when you are not up moving around and exerting yourself.  The breathing machine keeps your lungs expanded and your temperature down.   DIET:  As you were doing prior to hospitalization, we recommend a well-balanced diet.  DRESSING / WOUND CARE / SHOWERING  Keep the surgical dressing until follow up.  The dressing is water proof, so you can shower without any extra covering.  IF THE DRESSING FALLS OFF or the wound gets wet inside, change the dressing with sterile gauze.  Please use good hand washing techniques before changing the dressing.  Do not use any lotions or creams on the incision until instructed by your surgeon.    ACTIVITY  Increase activity slowly as tolerated, but follow the weight bearing instructions below.   No driving for 6 weeks or until further direction given by your physician.  You cannot drive while taking narcotics.  No lifting or carrying greater than 10 lbs. until further directed by your surgeon. Avoid periods of inactivity such as sitting longer than an hour when not asleep. This helps prevent blood clots.  You may return to work once you are authorized by your doctor.     WEIGHT BEARING   Weight bearing as tolerated with assist device (walker, cane, etc) as directed, use it as long as suggested by your surgeon or therapist, typically at least 4-6 weeks.   EXERCISES  Results after joint replacement surgery are often greatly improved when you follow the exercise, range of motion and muscle strengthening exercises prescribed by your doctor. Safety measures are also important to protect the joint from  further injury. Any time  any of these exercises cause you to have increased pain or swelling, decrease what you are doing until you are comfortable again and then slowly increase them. If you have problems or questions, call your caregiver or physical therapist for advice.   Rehabilitation is important following a joint replacement. After just a few days of immobilization, the muscles of the leg can become weakened and shrink (atrophy).  These exercises are designed to build up the tone and strength of the thigh and leg muscles and to improve motion. Often times heat used for twenty to thirty minutes before working out will loosen up your tissues and help with improving the range of motion but do not use heat for the first two weeks following surgery (sometimes heat can increase post-operative swelling).   These exercises can be done on a training (exercise) mat, on the floor, on a table or on a bed. Use whatever works the best and is most comfortable for you.    Use music or television while you are exercising so that the exercises are a pleasant break in your day. This will make your life better with the exercises acting as a break in your routine that you can look forward to.   Perform all exercises about fifteen times, three times per day or as directed.  You should exercise both the operative leg and the other leg as well.  Exercises include:   Quad Sets - Tighten up the muscle on the front of the thigh (Quad) and hold for 5-10 seconds.   Straight Leg Raises - With your knee straight (if you were given a brace, keep it on), lift the leg to 60 degrees, hold for 3 seconds, and slowly lower the leg.  Perform this exercise against resistance later as your leg gets stronger.  Leg Slides: Lying on your back, slowly slide your foot toward your buttocks, bending your knee up off the floor (only go as far as is comfortable). Then slowly slide your foot back down until your leg is flat on the floor again.  Angel Wings: Lying on your  back spread your legs to the side as far apart as you can without causing discomfort.  Hamstring Strength:  Lying on your back, push your heel against the floor with your leg straight by tightening up the muscles of your buttocks.  Repeat, but this time bend your knee to a comfortable angle, and push your heel against the floor.  You may put a pillow under the heel to make it more comfortable if necessary.   A rehabilitation program following joint replacement surgery can speed recovery and prevent re-injury in the future due to weakened muscles. Contact your doctor or a physical therapist for more information on knee rehabilitation.    CONSTIPATION  Constipation is defined medically as fewer than three stools per week and severe constipation as less than one stool per week.  Even if you have a regular bowel pattern at home, your normal regimen is likely to be disrupted due to multiple reasons following surgery.  Combination of anesthesia, postoperative narcotics, change in appetite and fluid intake all can affect your bowels.   YOU MUST use at least one of the following options; they are listed in order of increasing strength to get the job done.  They are all available over the counter, and you may need to use some, POSSIBLY even all of these options:    Drink plenty of fluids (prune juice  may be helpful) and high fiber foods Colace 100 mg by mouth twice a day  Senokot for constipation as directed and as needed Dulcolax (bisacodyl), take with full glass of water  Miralax (polyethylene glycol) once or twice a day as needed.  If you have tried all these things and are unable to have a bowel movement in the first 3-4 days after surgery call either your surgeon or your primary doctor.    If you experience loose stools or diarrhea, hold the medications until you stool forms back up.  If your symptoms do not get better within 1 week or if they get worse, check with your doctor.  If you experience "the  worst abdominal pain ever" or develop nausea or vomiting, please contact the office immediately for further recommendations for treatment.   ITCHING:  If you experience itching with your medications, try taking only a single pain pill, or even half a pain pill at a time.  You can also use Benadryl over the counter for itching or also to help with sleep.   TED HOSE STOCKINGS:  Use stockings on both legs until for at least 2 weeks or as directed by physician office. They may be removed at night for sleeping.  MEDICATIONS:  See your medication summary on the "After Visit Summary" that nursing will review with you.  You may have some home medications which will be placed on hold until you complete the course of blood thinner medication.  It is important for you to complete the blood thinner medication as prescribed.   Blood clot prevention (DVT Prophylaxis): After surgery you are at an increased risk for a blood clot. you were prescribed a blood thinner, Aspirin 81mg , to be taken twice daily for a total of 4 weeks from surgery to help reduce your risk of getting a blood clot. This will help prevent a blood clot. Signs of a pulmonary embolus (blood clot in the lungs) include sudden short of breath, feeling lightheaded or dizzy, chest pain with a deep breath, rapid pulse rapid breathing. Signs of a blood clot in your arms or legs include new unexplained swelling and cramping, warm, red or darkened skin around the painful area. Please call the office or 911 right away if these signs or symptoms develop.  PRECAUTIONS:  If you experience chest pain or shortness of breath - call 911 immediately for transfer to the hospital emergency department.   If you develop a fever greater that 101 F, purulent drainage from wound, increased redness or drainage from wound, foul odor from the wound/dressing, or calf pain - CONTACT YOUR SURGEON.                                                   FOLLOW-UP APPOINTMENTS:  If you  do not already have a post-op appointment, please call the office for an appointment to be seen by your surgeon.  Guidelines for how soon to be seen are listed in your "After Visit Summary", but are typically between 2-3 weeks after surgery.  OTHER INSTRUCTIONS:   Knee Replacement:  Do not place pillow under knee, focus on keeping the knee straight while resting. CPM instructions: 0-90 degrees, 2 hours in the morning, 2 hours in the afternoon, and 2 hours in the evening. Place foam block, curve side up under heel at all times  except when in CPM or when walking.  DO NOT modify, tear, cut, or change the foam block in any way.  POST-OPERATIVE OPIOID TAPER INSTRUCTIONS: It is important to wean off of your opioid medication as soon as possible. If you do not need pain medication after your surgery it is ok to stop day one. Opioids include: Codeine, Hydrocodone(Norco, Vicodin), Oxycodone(Percocet, oxycontin) and hydromorphone amongst others.  Long term and even short term use of opiods can cause: Increased pain response Dependence Constipation Depression Respiratory depression And more.  Withdrawal symptoms can include Flu like symptoms Nausea, vomiting And more Techniques to manage these symptoms Hydrate well Eat regular healthy meals Stay active Use relaxation techniques(deep breathing, meditating, yoga) Do Not substitute Alcohol to help with tapering If you have been on opioids for less than two weeks and do not have pain than it is ok to stop all together.  Plan to wean off of opioids This plan should start within one week post op of your joint replacement. Maintain the same interval or time between taking each dose and first decrease the dose.  Cut the total daily intake of opioids by one tablet each day Next start to increase the time between doses. The last dose that should be eliminated is the evening dose.   MAKE SURE YOU:  Understand these instructions.  Get help right away  if you are not doing well or get worse.    Thank you for letting us be a part of your medical care team.  It is a privilege we respect greatly.  We hope these instructions will help you stay on track for a fast and full recovery!            Signed: Maggie Senseney A Amoreena Neubert 09/14/2022, 6:53 AM

## 2022-09-14 NOTE — Progress Notes (Signed)
Physical Therapy Treatment Patient Details Name: Latasha Shepard MRN: [id number] DOB: 1970/08/31 Today's Date: 09/14/2022   History of Present Illness 52 yo female presents to therapy s/p R TKA on 09/13/2022 due to failure of conservative measures. Pt PMH includes but is not limited to: HDL, HTN, herpes and L TKA on 05/22/2022.    PT Comments  Pt continues motivated and progressing well with mobility.  Pt performed HEP with written instruction provided and reviewed.  Pt up to ambulate in hall and negotiated stairs.  Pt eager for dc home this date.     Assistance Recommended at Discharge Intermittent Supervision/Assistance  If plan is discharge home, recommend the following:  Can travel by private vehicle    A little help with walking and/or transfers;A little help with bathing/dressing/bathroom;Assistance with cooking/housework;Assist for transportation;Help with stairs or ramp for entrance      Equipment Recommendations  None recommended by PT    Recommendations for Other Services       Precautions / Restrictions Precautions Precautions: Knee;Fall Restrictions Weight Bearing Restrictions: No     Mobility  Bed Mobility               General bed mobility comments: Pt up in chair and requests back to same    Transfers Overall transfer level: Needs assistance Equipment used: Rolling walker (2 wheels) Transfers: Sit to/from Stand Sit to Stand: Min guard, Supervision           General transfer comment: min cues for hand placement and power up    Ambulation/Gait Ambulation/Gait assistance: Min guard, Supervision Gait Distance (Feet): 100 Feet Assistive device: Rolling walker (2 wheels) Gait Pattern/deviations: Step-to pattern, Antalgic Gait velocity: decreased     General Gait Details: min cues for posture, sequence and position from RW   Stairs Stairs: Yes Stairs assistance: Min assist Stair Management: One rail Right, Step to pattern, Forwards, With  cane Number of Stairs: 3 General stair comments: cues for cane placement and sequence   Wheelchair Mobility     Tilt Bed    Modified Rankin (Stroke Patients Only)       Balance Overall balance assessment: Needs assistance Sitting-balance support: Feet supported Sitting balance-Leahy Scale: Good     Standing balance support: No upper extremity supported Standing balance-Leahy Scale: Fair                              Cognition Arousal/Alertness: Awake/alert Behavior During Therapy: WFL for tasks assessed/performed Overall Cognitive Status: Within Functional Limits for tasks assessed                                          Exercises Total Joint Exercises Ankle Circles/Pumps: AROM, Both, 20 reps Quad Sets: AROM, Right, 10 reps, Supine Heel Slides: Right, AAROM, 15 reps, Supine Straight Leg Raises: AAROM, AROM, Right, 15 reps, Supine Long Arc Quad: AAROM, Right, 10 reps, Seated    General Comments        Pertinent Vitals/Pain Pain Assessment Pain Assessment: 0-10 Pain Score: 6  Pain Location: R knee Pain Descriptors / Indicators: Burning, Aching, Discomfort, Operative site guarding Pain Intervention(s): Limited activity within patient's tolerance, Monitored during session, Premedicated before session, Ice applied    Home Living  Prior Function            PT Goals (current goals can now be found in the care plan section) Acute Rehab PT Goals Patient Stated Goal: to be able to get back to normal walking PT Goal Formulation: With patient Time For Goal Achievement: 09/27/22 Potential to Achieve Goals: Good Progress towards PT goals: Progressing toward goals    Frequency    7X/week      PT Plan Current plan remains appropriate    Co-evaluation              AM-PAC PT "6 Clicks" Mobility   Outcome Measure  Help needed turning from your back to your side while in a flat bed  without using bedrails?: None Help needed moving from lying on your back to sitting on the side of a flat bed without using bedrails?: A Little Help needed moving to and from a bed to a chair (including a wheelchair)?: A Little Help needed standing up from a chair using your arms (e.g., wheelchair or bedside chair)?: A Little Help needed to walk in hospital room?: A Little Help needed climbing 3-5 steps with a railing? : None 6 Click Score: 20    End of Session Equipment Utilized During Treatment: Gait belt Activity Tolerance: Patient tolerated treatment well Patient left: in chair;with call bell/phone within reach;with family/visitor present Nurse Communication: Mobility status PT Visit Diagnosis: Unsteadiness on feet (R26.81);Other abnormalities of gait and mobility (R26.89);Muscle weakness (generalized) (M62.81);Difficulty in walking, not elsewhere classified (R26.2);Pain Pain - Right/Left: Right Pain - part of body: Knee;Leg     Time: 1610-9604 PT Time Calculation (min) (ACUTE ONLY): 33 min  Charges:    $Gait Training: 8-22 mins $Therapeutic Exercise: 8-22 mins PT General Charges $$ ACUTE PT VISIT: 1 Visit                     Mauro Kaufmann PT Acute Rehabilitation Services Pager [phone] Office [phone] .   Latasha Shepard 09/14/2022, 12:44 PM

## 2023-01-14 IMAGING — MG MM DIGITAL SCREENING BILAT W/ TOMO AND CAD
8 series · 8 of 24 positions shown · non-contrast
Comparison: Previous exam(s).

CLINICAL DATA: Screening.

EXAM:
DIGITAL SCREENING BILATERAL MAMMOGRAM WITH TOMOSYNTHESIS AND CAD
TECHNIQUE: Bilateral screening digital craniocaudal and mediolateral oblique
mammograms were obtained. Bilateral screening digital breast
tomosynthesis was performed. The images were evaluated with
computer-aided detection.

[L CC synth-2D]
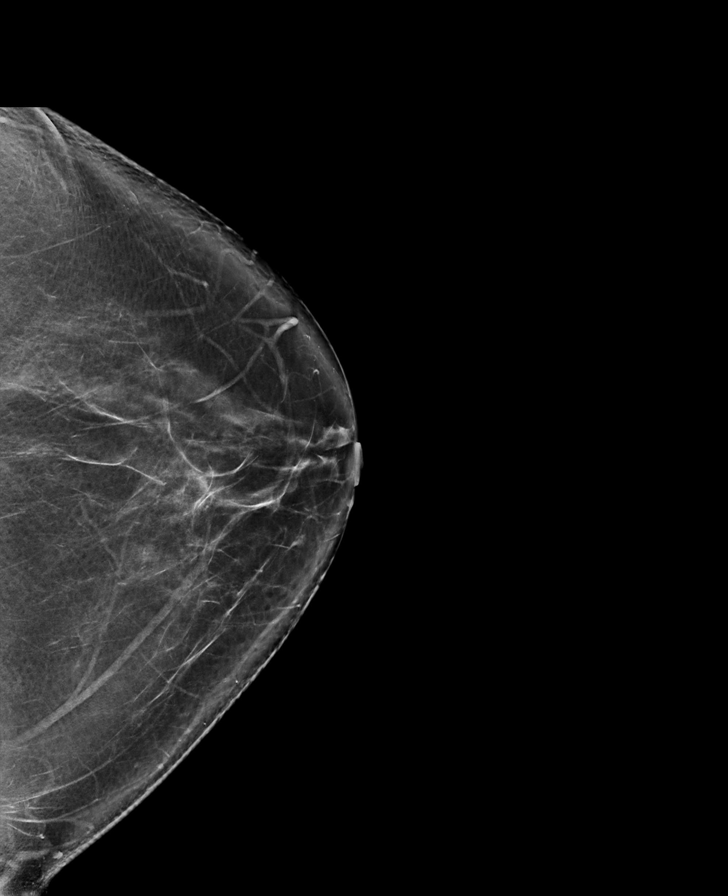

[R CC synth-2D]
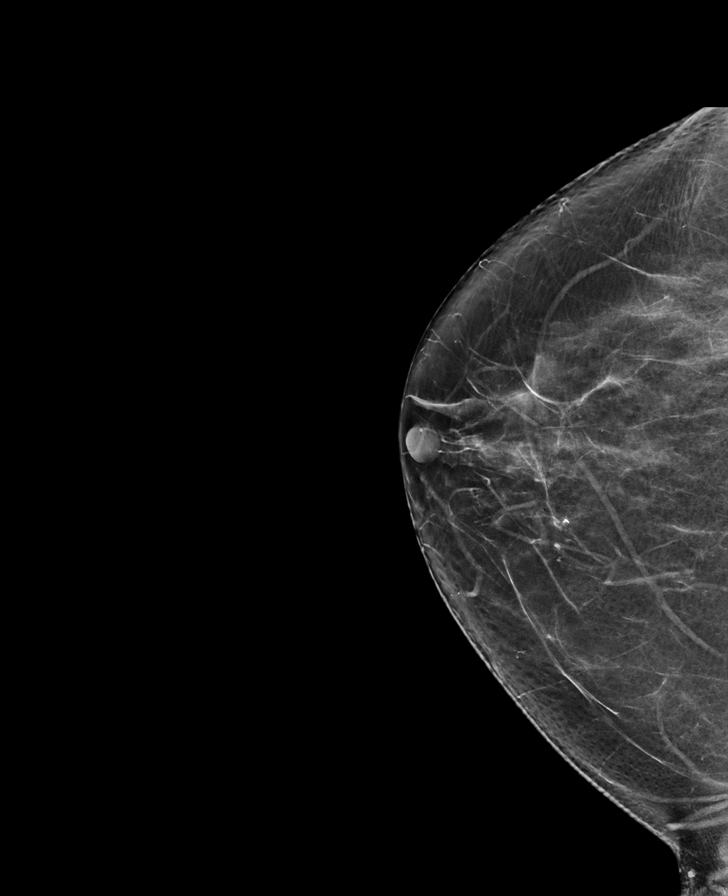

[L MLO synth-2D]
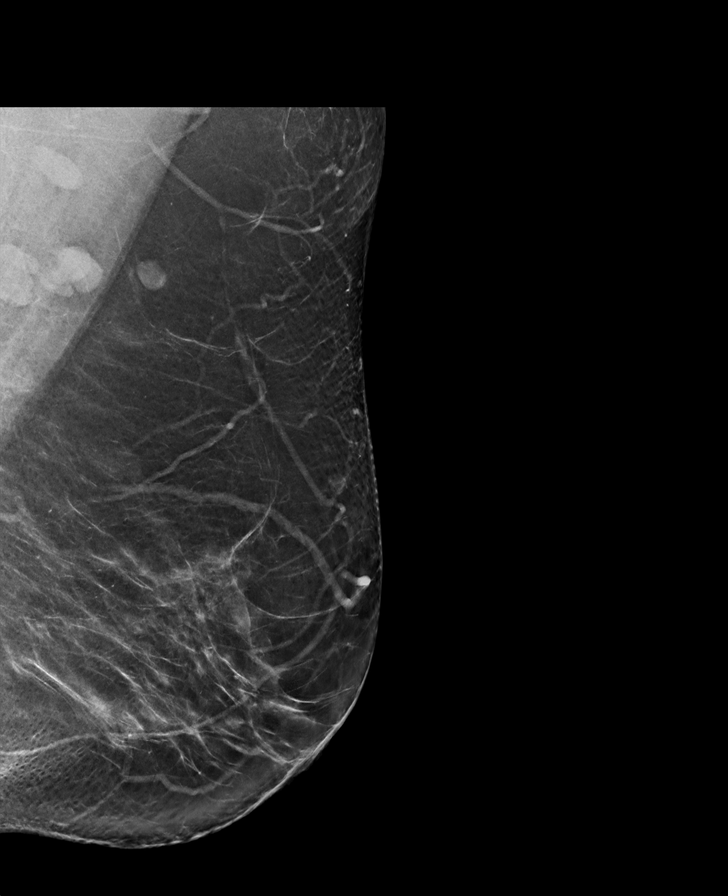

[R MLO synth-2D]
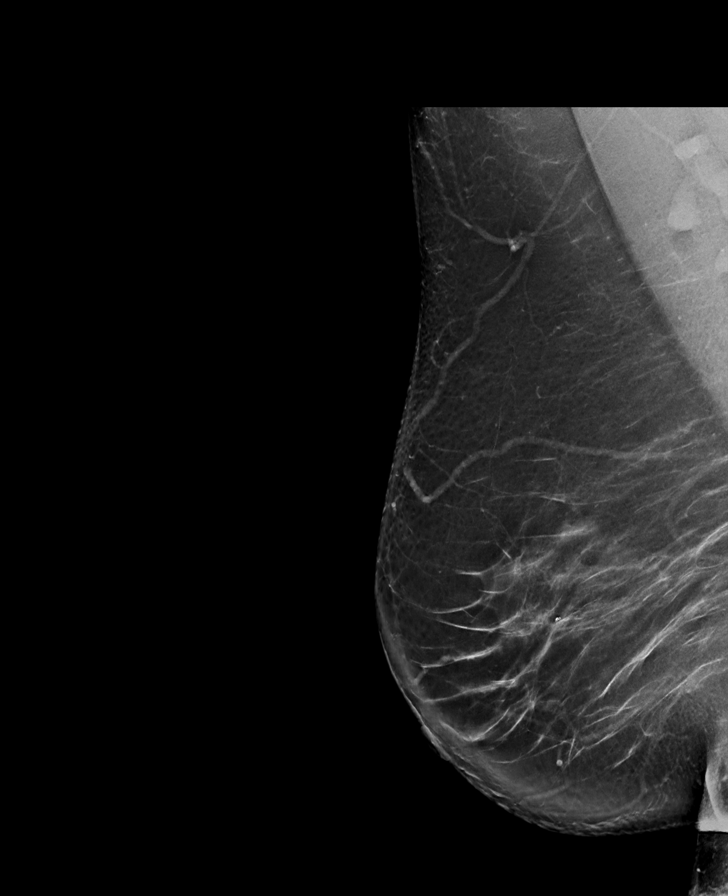

[L CC tomo · tomo slice 45/90.0]
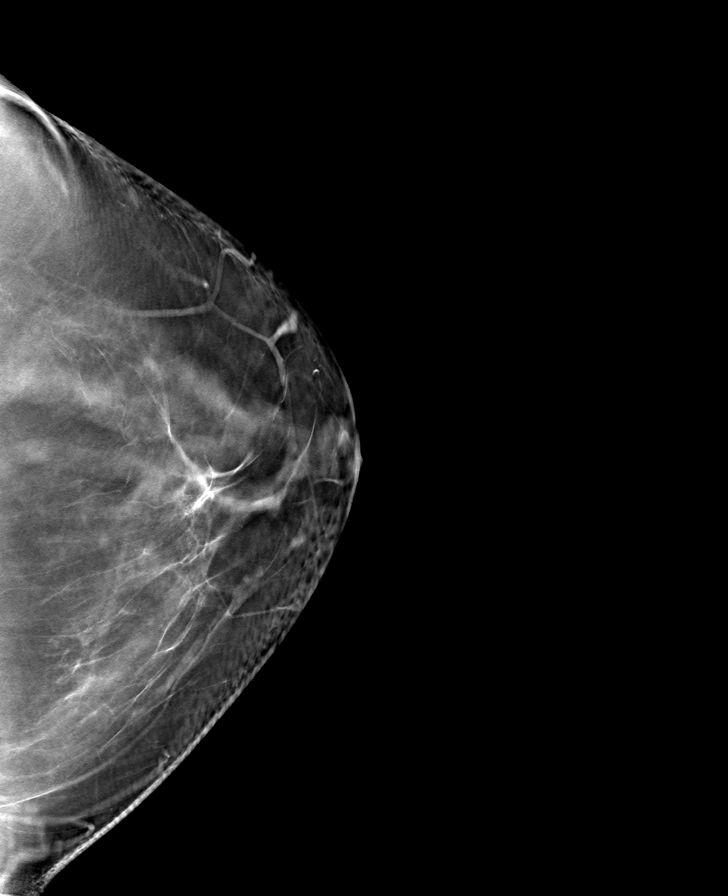

[R CC tomo · tomo slice 41/80.0]
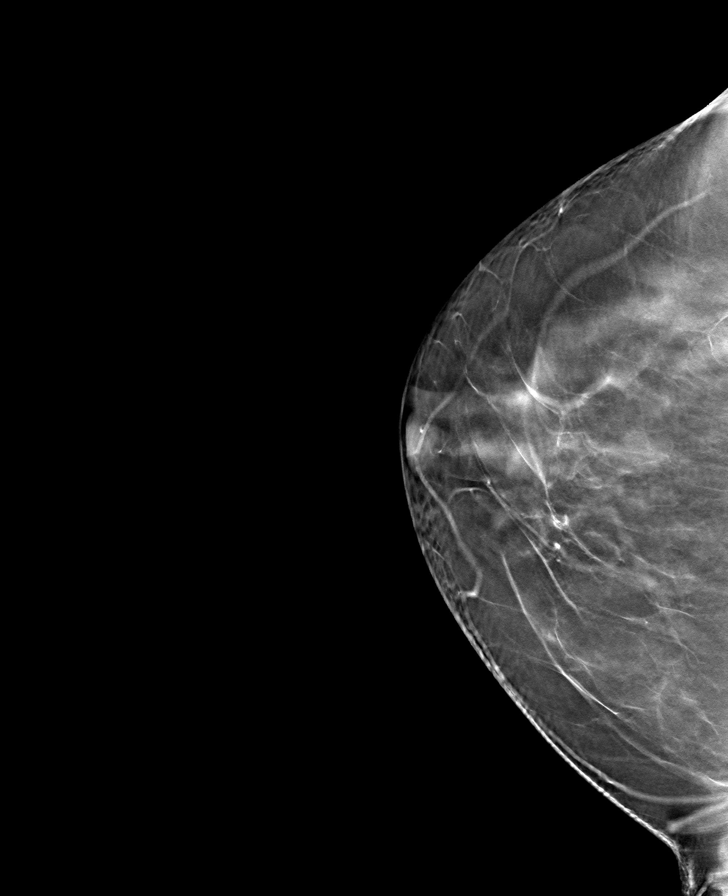

[L MLO tomo · tomo slice 47/93.0]
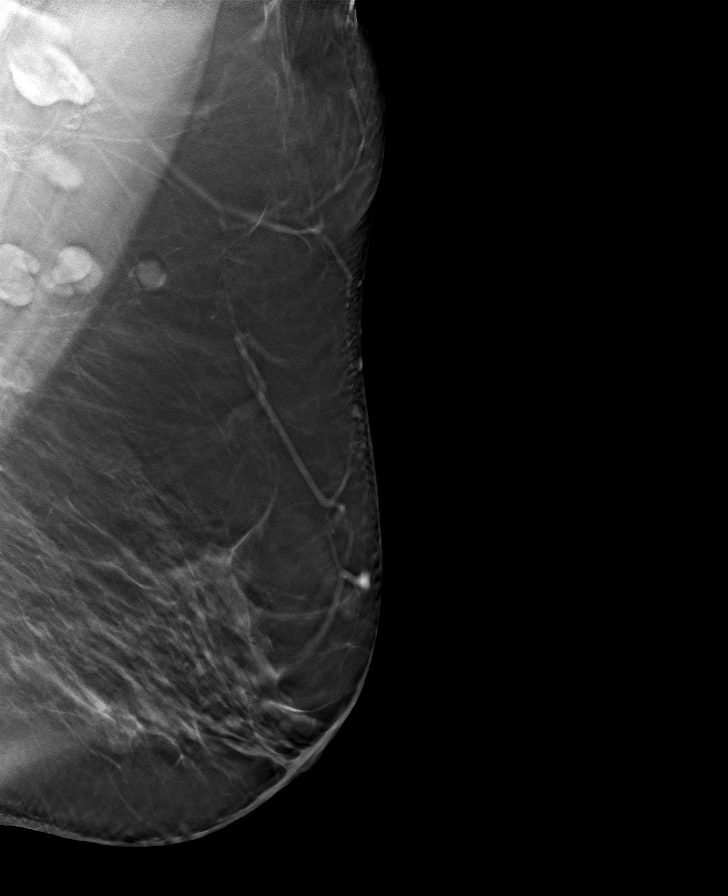

[R MLO tomo · tomo slice 52/103.0]
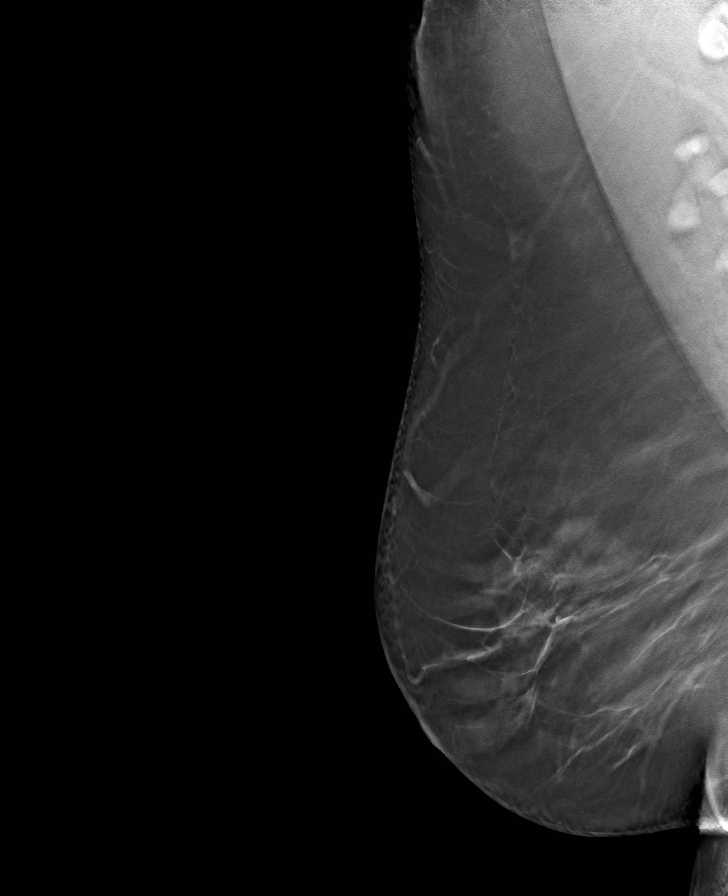

[8 of 24 positions shown; findings below may reference images not displayed]

ACR Breast Density Category b: There are scattered areas of
fibroglandular density.
FINDINGS: There are no findings suspicious for malignancy.
IMPRESSION: No mammographic evidence of malignancy. A result letter of this
screening mammogram will be mailed directly to the patient.

RECOMMENDATION:
Screening mammogram in one year. (Code:51-O-LD2)

BI-RADS CATEGORY  1: Negative.

## 2023-04-06 ENCOUNTER — Encounter (HOSPITAL_COMMUNITY): Payer: Self-pay | Admitting: *Deleted

## 2023-04-06 ENCOUNTER — Emergency Department (HOSPITAL_COMMUNITY): Payer: Commercial Managed Care - PPO

## 2023-04-06 ENCOUNTER — Emergency Department (HOSPITAL_COMMUNITY)
Admission: EM | Admit: 2023-04-06 | Discharge: 2023-04-07 | Disposition: A | Discharge status: Home / Self Care | Payer: Commercial Managed Care - PPO | Attending: Emergency Medicine | Admitting: Emergency Medicine

## 2023-04-06 ENCOUNTER — Other Ambulatory Visit: Payer: Self-pay

## 2023-04-06 DIAGNOSIS — Z7984 Long term (current) use of oral hypoglycemic drugs: Secondary | ICD-10-CM | POA: Insufficient documentation

## 2023-04-06 DIAGNOSIS — G43109 Migraine with aura, not intractable, without status migrainosus: Secondary | ICD-10-CM

## 2023-04-06 DIAGNOSIS — Z79899 Other long term (current) drug therapy: Secondary | ICD-10-CM | POA: Diagnosis not present

## 2023-04-06 DIAGNOSIS — R519 Headache, unspecified: Secondary | ICD-10-CM | POA: Diagnosis present

## 2023-04-06 DIAGNOSIS — G43809 Other migraine, not intractable, without status migrainosus: Secondary | ICD-10-CM | POA: Insufficient documentation

## 2023-04-06 DIAGNOSIS — R791 Abnormal coagulation profile: Secondary | ICD-10-CM | POA: Diagnosis not present

## 2023-04-06 DIAGNOSIS — I10 Essential (primary) hypertension: Secondary | ICD-10-CM | POA: Diagnosis not present

## 2023-04-06 DIAGNOSIS — H539 Unspecified visual disturbance: Secondary | ICD-10-CM

## 2023-04-06 DIAGNOSIS — E119 Type 2 diabetes mellitus without complications: Secondary | ICD-10-CM | POA: Insufficient documentation

## 2023-04-06 LAB — COMPREHENSIVE METABOLIC PANEL
ALT: 18 U/L (ref 0–44)
AST: 18 U/L (ref 15–41)
Albumin: 4 g/dL (ref 3.5–5.0)
Alkaline Phosphatase: 69 U/L (ref 38–126)
Anion gap: 11 (ref 5–15)
BUN: 14 mg/dL (ref 6–20)
CO2: 25 mmol/L (ref 22–32)
Calcium: 9.5 mg/dL (ref 8.9–10.3)
Chloride: 100 mmol/L (ref 98–111)
Creatinine, Ser: 0.76 mg/dL (ref 0.44–1.00)
GFR, Estimated: >60 mL/min (ref >60)
Glucose, Bld: 143 mg/dL — ABNORMAL HIGH (ref 70–99)
Potassium: 4.2 mmol/L (ref 3.5–5.1)
Sodium: 136 mmol/L (ref 135–145)
Total Bilirubin: 0.7 mg/dL (ref 0.0–1.2)
Total Protein: 7.2 g/dL (ref 6.5–8.1)

## 2023-04-06 LAB — CBC
HCT: 36.5 % (ref 36.0–46.0)
Hemoglobin: 12 g/dL (ref 12.0–15.0)
MCH: 29.5 pg (ref 26.0–34.0)
MCHC: 32.9 g/dL (ref 30.0–36.0)
MCV: 89.7 fL (ref 80.0–100.0)
Platelets: 355 10*3/uL (ref 150–400)
RBC: 4.07 MIL/uL (ref 3.87–5.11)
RDW: 13.2 % (ref 11.5–15.5)
WBC: 10 10*3/uL (ref 4.0–10.5)
nRBC: 0 % (ref 0.0–0.2)

## 2023-04-06 LAB — DIFFERENTIAL
Abs Immature Granulocytes: 0.02 10*3/uL (ref 0.00–0.07)
Basophils Absolute: 0.1 10*3/uL (ref 0.0–0.1)
Basophils Relative: 1 %
Eosinophils Absolute: 0.3 10*3/uL (ref 0.0–0.5)
Eosinophils Relative: 3 %
Immature Granulocytes: 0 %
Lymphocytes Relative: 37 %
Lymphs Abs: 3.7 10*3/uL (ref 0.7–4.0)
Monocytes Absolute: 0.6 10*3/uL (ref 0.1–1.0)
Monocytes Relative: 6 %
Neutro Abs: 5.3 10*3/uL (ref 1.7–7.7)
Neutrophils Relative %: 53 %

## 2023-04-06 LAB — I-STAT CHEM 8, ED
BUN: 15 mg/dL (ref 6–20)
Calcium, Ion: 1.16 mmol/L (ref 1.15–1.40)
Chloride: 100 mmol/L (ref 98–111)
Creatinine, Ser: 0.8 mg/dL (ref 0.44–1.00)
Glucose, Bld: 139 mg/dL — ABNORMAL HIGH (ref 70–99)
HCT: 37 % (ref 36.0–46.0)
Hemoglobin: 12.6 g/dL (ref 12.0–15.0)
Potassium: 4.3 mmol/L (ref 3.5–5.1)
Sodium: 136 mmol/L (ref 135–145)
TCO2: 26 mmol/L (ref 22–32)

## 2023-04-06 LAB — CBG MONITORING, ED: Glucose-Capillary: 110 mg/dL — ABNORMAL HIGH (ref 70–99)

## 2023-04-06 LAB — URINALYSIS, ROUTINE W REFLEX MICROSCOPIC
Bilirubin Urine: NEGATIVE
Glucose, UA: NEGATIVE mg/dL
Hgb urine dipstick: NEGATIVE
Ketones, ur: NEGATIVE mg/dL
Leukocytes,Ua: NEGATIVE
Nitrite: NEGATIVE
Protein, ur: NEGATIVE mg/dL
Specific Gravity, Urine: 1.006 (ref 1.005–1.030)
pH: 6 (ref 5.0–8.0)

## 2023-04-06 LAB — RAPID URINE DRUG SCREEN, HOSP PERFORMED
Amphetamines: NOT DETECTED
Barbiturates: NOT DETECTED
Benzodiazepines: NOT DETECTED
Cocaine: NOT DETECTED
Opiates: NOT DETECTED
Tetrahydrocannabinol: NOT DETECTED

## 2023-04-06 LAB — APTT: aPTT: 29 s (ref 24–36)

## 2023-04-06 LAB — PROTIME-INR
INR: 1 (ref 0.8–1.2)
Prothrombin Time: 13.6 s (ref 11.4–15.2)

## 2023-04-06 LAB — ETHANOL: Alcohol, Ethyl (B): <10 mg/dL (ref <10)

## 2023-04-06 MED ORDER — KETOROLAC TROMETHAMINE 30 MG/ML IJ SOLN
30.0000 mg | Freq: Once | INTRAMUSCULAR | Status: AC
  Administered 2023-04-06: 30 mg via INTRAVENOUS
  Filled 2023-04-06: qty 1

## 2023-04-06 MED ORDER — LORAZEPAM 2 MG/ML IJ SOLN
0.5000 mg | Freq: Once | INTRAMUSCULAR | Status: AC
  Administered 2023-04-06: 0.5 mg via INTRAVENOUS
  Filled 2023-04-06: qty 1

## 2023-04-06 NOTE — Code Documentation (Signed)
Responded to Code Stroke called on pt already in the ED. Pt arrived to the ED at 1803 for headache and blurred vision. Code Stroke called at 2013 for headache and mild confusion, [id number]. NIH-0 on assessment in CT. CT-negative. TNK not given-NIH-0. Code Stroke cancelled at 2033.

## 2023-04-06 NOTE — Consult Note (Signed)
NEUROLOGY CONSULT NOTE   Date of service: April 06, 2023 Patient Name: Latasha Shepard MRN:  [id number] DOB:  [birth year] Chief Complaint: "headache, blurred vision," Requesting Provider: Alvira Monday, MD  History of Present Illness  Latasha Shepard is a 53 y.o. female with hx of migraines, GERD, DM2, Htn, HLD, who around 1600, had blurred vision in the entire visual field. Difficulty reading, then developed disorientation, felt anxious and then had a 4/10 throbbing headache that she decribes as frontal and over the vertex. Headache worse with stand up or moving, cough does not worsen it.  Had a prior similar episode 10 years ago.  A code stoke was activated in the ED. On my evaluation, she is back to her baseline and has no deficits.  LKW: 1600 Modified rankin score: 0-Completely asymptomatic and back to baseline post- stroke IV Thrombolysis: not offered, no deficits EVT: not offered, no deficits.  NIHSS components Score: Comment  1a Level of Conscious 0[x]  1[]  2[]  3[]      1b LOC Questions 0[x]  1[]  2[]       1c LOC Commands 0[x]  1[]  2[]       2 Best Gaze 0[x]  1[]  2[]       3 Visual 0[x]  1[]  2[]  3[]      4 Facial Palsy 0[x]  1[]  2[]  3[]      5a Motor Arm - left 0[x]  1[]  2[]  3[]  4[]  UN[]    5b Motor Arm - Right 0[x]  1[]  2[]  3[]  4[]  UN[]    6a Motor Leg - Left 0[x]  1[]  2[]  3[]  4[]  UN[]    6b Motor Leg - Right 0[x]  1[]  2[]  3[]  4[]  UN[]    7 Limb Ataxia 0[x]  1[]  2[]  3[]  UN[]     8 Sensory 0[x]  1[]  2[]  UN[]      9 Best Language 0[x]  1[]  2[]  3[]      10 Dysarthria 0[x]  1[]  2[]  UN[]      11 Extinct. and Inattention 0[x]  1[]  2[]       TOTAL: 0      ROS  Comprehensive ROS performed and pertinent positives documented in HPI    Past History   Past Medical History:  Diagnosis Date   Anxiety    Arthritis    Complication of anesthesia    Patient is a red-head and it takes more anesthesia. She has woke up during surgery. Spinal didn't take well in the past.   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   Procedure: TOTAL KNEE ARTHROPLASTY;  Surgeon: Joen Laura, MD;  Location: [facility];  Service: Orthopedics;  Laterality: Left;   TOTAL KNEE ARTHROPLASTY Right 09/13/2022   Procedure: TOTAL KNEE ARTHROPLASTY;  Surgeon: Joen Laura, MD;  Location: [facility];  Service: Orthopedics;  Laterality: Right;   TUBAL LIGATION     WISDOM TOOTH EXTRACTION  1992    Family History: Family History  Problem Relation Age of Onset   Breast cancer Mother    Hypertension Mother    Diabetes Father    Hypertension Father    Melanoma Maternal Grandmother    Stroke Maternal Grandfather    Heart Problems Paternal Grandfather     Social History  reports that she quit smoking  about 25 years ago. Her smoking use included cigarettes. She has never used smokeless tobacco. She reports current alcohol use. She reports that she does not use drugs.  No Known Allergies  Medications  No current facility-administered medications for this encounter.  Current Outpatient Medications:    amLODipine (NORVASC) 10 MG tablet, Take 10 mg by mouth at bedtime., Disp: , Rfl:    atorvastatin (LIPITOR) 80 MG tablet, Take 80 mg by mouth at bedtime., Disp: , Rfl:    citalopram (CELEXA) 10 MG tablet, Take 10 mg by mouth at bedtime., Disp: , Rfl:    lisinopril (ZESTRIL) 10 MG tablet, Take 10 mg by mouth at bedtime., Disp: , Rfl:    metFORMIN (GLUCOPHAGE) 500 MG tablet, Take 500 mg by mouth 2 (two) times daily., Disp: , Rfl:    omeprazole (PRILOSEC) 20 MG capsule, Take 20 mg by mouth at bedtime., Disp: , Rfl:    polyethylene glycol (MIRALAX) 17 g packet, Take 17 g by mouth daily., Disp: 14 each, Rfl: 0   Povidone, PF,  (IVIZIA DRY EYES) 0.5 % SOLN, Place 1 drop into both eyes 2 (two) times daily., Disp: , Rfl:    valACYclovir (VALTREX) 1000 MG tablet, Take 500 mg by mouth daily as needed (Cold sore)., Disp: , Rfl:   Vitals   Vitals:   04/06/23 1943 04/06/23 1946  BP: (!) 141/83   Pulse: 71   Resp: 16   Temp: 98.7 F (37.1 C)   TempSrc: Oral   SpO2: 99%   Weight:  102.1 kg  Height:  5\' 4"  (1.626 m)    Body mass index is 38.64 kg/m.  Physical Exam   General: Laying comfortably in bed; in no acute distress.  HENT: Normal oropharynx and mucosa. Normal external appearance of ears and nose.  Neck: Supple, no pain or tenderness  CV: No JVD. No peripheral edema.  Pulmonary: Symmetric Chest rise. Normal respiratory effort.  Abdomen: Soft to touch, non-tender.  Ext: No cyanosis, edema, or deformity  Skin: No rash. Normal palpation of skin.   Musculoskeletal: Normal digits and nails by inspection. No clubbing.   Neurologic Examination  Mental status/Cognition: Alert, oriented to self, place, month and year, good attention.  Speech/language: Fluent, comprehension intact, object naming intact, repetition intact.  Cranial nerves:   CN II Pupils equal and reactive to light, no VF deficits    CN III,IV,VI EOM intact, no gaze preference or deviation, no nystagmus    CN V normal sensation in V1, V2, and V3 segments bilaterally    CN VII no asymmetry, no nasolabial fold flattening    CN VIII normal hearing to speech    CN IX & X normal palatal elevation, no uvular deviation    CN XI 5/5 head turn and 5/5 shoulder shrug bilaterally    CN XII midline tongue protrusion    Motor:  Muscle bulk: normal, tone normal, pronator drift none tremor none Mvmt Root Nerve  Muscle Right Left Comments  SA C5/6 Ax Deltoid 5 5   EF C5/6 Mc Biceps 5 5   EE C6/7/8 Rad Triceps 5 5   WF C6/7 Med FCR     WE C7/8 PIN ECU     F Ab C8/T1 U ADM/FDI 5 5   HF L1/2/3 Fem Illopsoas 5 5   KE L2/3/4 Fem Quad 5 5   DF L4/5 D  [place]on Tib Ant 5 5   PF S1/2 Tibial Grc/Sol 5 5    Sensation:  Light  touch Intact throughout   Pin prick    Temperature    Vibration   Proprioception    Coordination/Complex Motor:  - Finger to Nose intact BL - Heel to shin intact BL - Rapid alternating movement are normal - Gait: deferred.  Labs/Imaging/Neurodiagnostic studies   CBC: No results for input(s): "WBC", "NEUTROABS", "HGB", "HCT", "MCV", "PLT" in the last 168 hours. Basic Metabolic Panel:  Lab Results  Component Value Date   NA 136 09/14/2022   K 4.1 09/14/2022   CO2 26 09/14/2022   GLUCOSE 130 (H) 09/14/2022   BUN 17 09/14/2022   CREATININE 0.61 09/14/2022   CALCIUM 8.6 (L) 09/14/2022   GFRNONAA >60 09/14/2022   GFRAA >60 11/02/2010   Lipid Panel:  Lab Results  Component Value Date   LDLCALC 138 (H) 03/29/2011   HgbA1c:  Lab Results  Component Value Date   HGBA1C 5.3 05/10/2022   Urine Drug Screen: No results found for: "LABOPIA", "COCAINSCRNUR", "LABBENZ", "AMPHETMU", "THCU", "LABBARB"  Alcohol Level No results found for: "ETH" INR  Lab Results  Component Value Date   INR 1.06 11/02/2010   APTT  Lab Results  Component Value Date   APTT 32 11/02/2010   AED levels: No results found for: "PHENYTOIN", "ZONISAMIDE", "LAMOTRIGINE", "LEVETIRACETA"  CT Head without contrast(Personally reviewed): CTH was negative for a large hypodensity concerning for a large territory infarct or hyperdensity concerning for an ICH  MRI Brain(Personally reviewed): Pending  ASSESSMENT   Latasha Shepard is a 53 y.o. female with hx of migraines, GERD, DM2, Htn, HLD, who around 1600, had blurred vision in the entire visual field. Difficulty reading, then developed disorientation, felt anxious and then had a 4/10 throbbing headache that she decribes as frontal and over the vertex.   Had prior similar episode about 10 years ago.  Suspect this is likely a migraine with aura. Would be hard to localize all of her  symptoms to a single lesion in the cerebral cortex and therefore low suspicion for stroke/TIA.  RECOMMENDATIONS  - MRI Brain w/o C and if negative, okay for discharge with outpatient neurology follow up. ______________________________________________________________________    Signed, Erick Blinks, MD [facility]

## 2023-04-06 NOTE — ED Provider Triage Note (Signed)
Emergency Medicine Provider Triage Evaluation Note  Latasha Shepard , a 53 y.o. female  was evaluated in triage.  Pt complains of blurry vision and headache.  Review of Systems  Positive:  Negative:   Physical Exam  BP (!) 141/83   Pulse 71   Temp 98.7 F (37.1 C) (Oral)   Resp 16   Ht 5\' 4"  (1.626 m)   Wt 102.1 kg   LMP 08/17/2013   SpO2 99%   BMI 38.64 kg/m  Gen:   Awake, no distress   Resp:  Normal effort  MSK:   Moves extremities without difficulty  Other:    Medical Decision Making  Medically screening exam initiated at 7:54 PM.  Appropriate orders placed.  Ashanti Ratti was informed that the remainder of the evaluation will be completed by another provider, this initial triage assessment does not replace that evaluation, and the importance of remaining in the ED until their evaluation is complete.  Blurry vision and confusion at 4:30PM which lasted up until approx 30 min ago. Patient now with headache increasing in severity. Stating that the headache is severe. Denies hx of headaches. Also with some nausea now. Denies any other infectious symptoms.   Patient initially went to UC who referred patient to ED.   Dorthy Cooler, [place] 04/06/23 2006

## 2023-04-06 NOTE — ED Provider Notes (Signed)
[place] EMERGENCY DEPARTMENT AT [facility] Provider Note   CSN: [id number] Arrival date & time: 04/06/23  1803  An emergency department physician performed an initial assessment on this suspected stroke patient at 2011.  History {Add pertinent medical, surgical, social history, OB history to HPI:1} Chief Complaint  Patient presents with   Headache    Latasha Shepard is a 53 y.o. female.  HPI       Home Medications Prior to Admission medications   Medication Sig Start Date End Date Taking? Authorizing Provider  amLODipine (NORVASC) 10 MG tablet Take 10 mg by mouth at bedtime.    [provider]  atorvastatin (LIPITOR) 80 MG tablet Take 80 mg by mouth at bedtime.    [provider]  citalopram (CELEXA) 10 MG tablet Take 10 mg by mouth at bedtime.    [provider]  lisinopril (ZESTRIL) 10 MG tablet Take 10 mg by mouth at bedtime.    [provider]  metFORMIN (GLUCOPHAGE) 500 MG tablet Take 500 mg by mouth 2 (two) times daily. 04/03/22   [provider]  omeprazole (PRILOSEC) 20 MG capsule Take 20 mg by mouth at bedtime.    [provider]  polyethylene glycol (MIRALAX) 17 g packet Take 17 g by mouth daily. 09/13/22   Cecil Cobbs, PA-C  Povidone, PF, (IVIZIA DRY EYES) 0.5 % SOLN Place 1 drop into both eyes 2 (two) times daily.    [provider]  valACYclovir (VALTREX) 1000 MG tablet Take 500 mg by mouth daily as needed (Cold sore). 04/28/22   [provider]      Allergies    Patient has no known allergies.    Review of Systems   Review of Systems  Physical Exam Updated Vital Signs BP (!) 141/83   Pulse 71   Temp 98.7 F (37.1 C) (Oral)   Resp 16   Ht 5\' 4"  (1.626 m)   Wt 102.1 kg   LMP 08/17/2013   SpO2 99%   BMI 38.64 kg/m  Physical Exam  ED Results / Procedures / Treatments   Labs (all labs ordered are listed, but only abnormal results are displayed) Labs  Reviewed  URINALYSIS, ROUTINE W REFLEX MICROSCOPIC - Abnormal; Notable for the following components:      Result Value   Color, Urine STRAW (*)    All other components within normal limits  COMPREHENSIVE METABOLIC PANEL - Abnormal; Notable for the following components:   Glucose, Bld 143 (*)    All other components within normal limits  I-STAT CHEM 8, ED - Abnormal; Notable for the following components:   Glucose, Bld 139 (*)    All other components within normal limits  CBG MONITORING, ED - Abnormal; Notable for the following components:   Glucose-Capillary 110 (*)    All other components within normal limits  ETHANOL  PROTIME-INR  APTT  CBC  DIFFERENTIAL  RAPID URINE DRUG SCREEN, HOSP PERFORMED    EKG None  Radiology CT HEAD CODE STROKE WO CONTRAST Result Date: 04/06/2023 CLINICAL DATA:  Code stroke.  Neuro deficit, acute, stroke suspected EXAM: CT HEAD WITHOUT CONTRAST TECHNIQUE: Contiguous axial images were obtained from the base of the skull through the vertex without intravenous contrast. RADIATION DOSE REDUCTION: This exam was performed according to the departmental dose-optimization program which includes automated exposure control, adjustment of the mA and/or kV according to patient size and/or use of iterative reconstruction technique. COMPARISON:  None Available. FINDINGS: Brain: No evidence  of acute large vascular territory infarction, hemorrhage, hydrocephalus, extra-axial collection or mass lesion/mass effect. Vascular: No hyperdense vessel. Skull: No acute fracture. Sinuses/Orbits: Clear sinuses.  No acute orbital findings. Other: No mastoid effusions. ASPECTS [facility] Stroke Program Early CT Score) Total score (0-10 with 10 being normal): 10. IMPRESSION: No evidence of acute intracranial abnormality.  ASPECTS is 10. Code stroke imaging results were communicated on 04/06/2023 at 8:24 pm to provider Dr. Derry Lory via secure text paging. Electronically Signed   By: Feliberto Harts M.D.   On: 04/06/2023 20:25    Procedures Procedures  {Document cardiac monitor, telemetry assessment procedure when appropriate:1}  Medications Ordered in ED Medications - No data to display  ED Course/ Medical Decision Making/ A&P   {   Click here for ABCD2, HEART and other calculatorsREFRESH Note before signing :1}                              Medical Decision Making  ***  {Document critical care time when appropriate:1} {Document review of labs and clinical decision tools ie heart score, Chads2Vasc2 etc:1}  {Document your independent review of radiology images, and any outside records:1} {Document your discussion with family members, caretakers, and with consultants:1} {Document social determinants of health affecting pt's care:1} {Document your decision making why or why not admission, treatments were needed:1} Final Clinical Impression(s) / ED Diagnoses Final diagnoses:  None    Rx / DC Orders ED Discharge Orders     None

## 2023-04-06 NOTE — ED Triage Notes (Signed)
Headache for approx 2 hours no med taken she felt like her vision was blurred    also no blurred vision at present

## 2023-07-20 ENCOUNTER — Encounter: Payer: Self-pay | Admitting: Diagnostic Neuroimaging

## 2023-07-20 ENCOUNTER — Ambulatory Visit: Payer: Commercial Managed Care - PPO | Admitting: Diagnostic Neuroimaging

## 2023-07-20 VITALS — BP 117/81 | HR 75 | Ht 65.0 in | Wt 242.0 lb

## 2023-07-20 DIAGNOSIS — G43109 Migraine with aura, not intractable, without status migrainosus: Secondary | ICD-10-CM

## 2023-07-20 NOTE — Progress Notes (Signed)
 [facility]  PATIENT: Latasha Shepard DOB: Apr 04, 1970  REFERRING CLINICIAN: Scarlette Currier, MD HISTORY FROM: patient  REASON FOR VISIT: new consult   HISTORICAL  CHIEF COMPLAINT:  Chief Complaint  Patient presents with   Seizures    Rm 7 alone  Pt is  well, reports in the last 10 yrs she has had 2 episodes where she would have impaired vision,unable to speak, felt disoriented and confused, anxious and developed a headache.Symptoms will last for about 2 hrs.     HISTORY OF PRESENT ILLNESS:   53 year old female here for evaluation of abnormal spells.  Patient has history of migraine headaches since high school.  Typical headaches consist of severe throbbing pain, nausea and sensitivity light.  These were managed conservatively over the years.  2015 patient had episodes with abnormal visual disturbance, half of her visual field blurred/loss, time slowing down confusion spell followed by headache lasting hours.  Was diagnosed with possible complicated migraine.  Patient had a similar recurrence of symptoms in Jan 2025 consisting of visual disturbance and headache.  This time had patchy visual obscuration and disturbance, confusion and anxiety, and headache lasting hours.  Went to the emergency room for evaluation, had MRI of the brain which was unremarkable and again was diagnosed with possible complicated migraine.  Since then patient is doing well.  No recurrence of symptoms.   REVIEW OF SYSTEMS: Full 14 system review of systems performed and negative with exception of: as per HPI.   ALLERGIES: No Known Allergies  HOME MEDICATIONS: Outpatient Medications Prior to Visit  Medication Sig Dispense Refill   amLODipine  (NORVASC ) 10 MG tablet Take 10 mg by mouth at bedtime.     atorvastatin  (LIPITOR) 80 MG tablet Take 80 mg by mouth at bedtime.     citalopram  (CELEXA ) 10 MG tablet Take 10 mg by mouth at bedtime.     lisinopril  (ZESTRIL ) 10 MG tablet Take  10 mg by mouth at bedtime.     metFORMIN  (GLUCOPHAGE ) 500 MG tablet Take 500 mg by mouth 2 (two) times daily.     omeprazole (PRILOSEC) 20 MG capsule Take 20 mg by mouth at bedtime.     OZEMPIC, 0.25 OR 0.5 MG/DOSE, 2 MG/3ML SOPN INJECT 0.25MG  SUBCUTANEOUS ONCE A WEEK FOR 4 WEEKS, AND WILL THEN MOVE UP TO 0.5 ONCE A WEEK     valACYclovir  (VALTREX ) 1000 MG tablet Take 500 mg by mouth daily as needed (Cold sore).     polyethylene glycol (MIRALAX ) 17 g packet Take 17 g by mouth daily. (Patient not taking: Reported on 07/20/2023) 14 each 0   Povidone, PF, (IVIZIA DRY EYES) 0.5 % SOLN Place 1 drop into both eyes 2 (two) times daily. (Patient not taking: Reported on 07/20/2023)     No facility-administered medications prior to visit.    PAST MEDICAL HISTORY: Past Medical History:  Diagnosis Date   Anxiety    Arthritis    Complication of anesthesia    Patient is a red-head and it takes more anesthesia. She has woke up during surgery. Spinal didn't take well in the past.   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   Procedure: TOTAL KNEE ARTHROPLASTY;  Surgeon: Murleen Arms, MD;  Location: [facility];  Service: Orthopedics;  Laterality: Left;   TOTAL KNEE ARTHROPLASTY Right 09/13/2022   Procedure: TOTAL KNEE ARTHROPLASTY;  Surgeon: Murleen Arms, MD;  Location: [facility];  Service: Orthopedics;  Laterality: Right;   TUBAL LIGATION     WISDOM TOOTH EXTRACTION  1992    FAMILY HISTORY: Family History  Problem Relation Age of Onset   Breast cancer Mother    Hypertension Mother    Diabetes Father    Hypertension Father    Melanoma Maternal Grandmother    Stroke Maternal Grandfather     Heart Problems Paternal Grandfather     SOCIAL HISTORY: Social History   Socioeconomic History   Marital status: Married    Spouse name: Not on file   Number of children: Not on file   Years of education: Not on file   Highest education level: Not on file  Occupational History   Not on file  Tobacco Use   Smoking status: Former    Current packs/day: 0.00    Types: Cigarettes    Quit date: 2000    Years since quitting: 25.3   Smokeless tobacco: Never  Vaping Use   Vaping status: Never Used  Substance and Sexual Activity   Alcohol  use: Yes    Comment: couple times a week   Drug use: No   Sexual activity: Yes    Birth control/protection: Surgical    Comment: BTL  Other Topics Concern   Not on file  Social History Narrative   Not on file   Social Drivers of Health   Financial Resource Strain: Not on file  Food Insecurity: No Food Insecurity (09/13/2022)   Hunger Vital Sign    Worried About Running Out of Food in the Last Year: Never true    Ran Out of Food in the Last Year: Never true  Transportation Needs: No Transportation Needs (09/13/2022)   PRAPARE - Administrator, Civil Service (Medical): No    Lack of Transportation (Non-Medical): No  Physical Activity: Not on file  Stress: Not on file  Social Connections: Not on file  Intimate Partner Violence: Not At Risk (09/13/2022)   Humiliation, Afraid, Rape, and Kick questionnaire    Fear of Current or Ex-Partner: No    Emotionally Abused: No    Physically Abused: No    Sexually Abused: No     PHYSICAL EXAM  GENERAL EXAM/CONSTITUTIONAL: Vitals:  Vitals:   07/20/23 0925  BP: 117/81  Pulse: 75  Weight: 242 lb (109.8 kg)  Height: 5\' 5"  (1.651 m)   Body mass index is 40.27 kg/m. Wt Readings from Last 3 Encounters:  07/20/23 242 lb (109.8 kg)  04/06/23 225 lb 1.4 oz (102.1 kg)  09/13/22 225 lb (102.1 kg)   Patient is in no distress; well developed, nourished and groomed; neck is  supple  CARDIOVASCULAR: Examination of carotid arteries is normal; no carotid bruits Regular rate and rhythm, no murmurs Examination of peripheral vascular system by observation and palpation is normal  EYES: Ophthalmoscopic exam of optic discs and posterior segments is normal; no papilledema or hemorrhages No results found.  MUSCULOSKELETAL: Gait, strength, tone, movements noted in Neurologic exam below  NEUROLOGIC: MENTAL STATUS:      No data to display         awake, alert, oriented to person, place and time recent and remote memory intact  normal attention and concentration language fluent, comprehension intact, naming intact fund of knowledge appropriate  CRANIAL NERVE:  2nd - no papilledema on fundoscopic exam 2nd, 3rd, 4th, 6th - pupils equal and reactive to light, visual fields full to confrontation, extraocular muscles intact, no nystagmus 5th - facial sensation symmetric 7th - facial strength symmetric 8th - hearing intact 9th - palate elevates symmetrically, uvula midline 11th - shoulder shrug symmetric 12th - tongue protrusion midline  MOTOR:  normal bulk and tone, full strength in the BUE, BLE  SENSORY:  normal and symmetric to light touch, temperature, vibration  COORDINATION:  finger-nose-finger, fine finger movements normal  REFLEXES:  deep tendon reflexes TRACE and symmetric  GAIT/STATION:  narrow based gait     DIAGNOSTIC DATA (LABS, IMAGING, TESTING) - I reviewed patient records, labs, notes, testing and imaging myself where available.  Lab Results  Component Value Date   WBC 10.0 04/06/2023   HGB 12.6 04/06/2023   HCT 37.0 04/06/2023   MCV 89.7 04/06/2023   PLT 355 04/06/2023      Component Value Date/Time   NA 136 04/06/2023 2019   K 4.3 04/06/2023 2019   CL 100 04/06/2023 2019   CO2 25 04/06/2023 2010   GLUCOSE 139 (H) 04/06/2023 2019   BUN 15 04/06/2023 2019   CREATININE 0.80 04/06/2023 2019   CALCIUM  9.5 04/06/2023  2010   PROT 7.2 04/06/2023 2010   ALBUMIN 4.0 04/06/2023 2010   AST 18 04/06/2023 2010   ALT 18 04/06/2023 2010   ALKPHOS 69 04/06/2023 2010   BILITOT 0.7 04/06/2023 2010   GFRNONAA >60 04/06/2023 2010   GFRAA >60 11/02/2010 1325   Lab Results  Component Value Date   CHOL 208 (H) 03/29/2011   HDL 49 03/29/2011   LDLCALC 138 (H) 03/29/2011   TRIG 105 03/29/2011   CHOLHDL 4.2 03/29/2011   Lab Results  Component Value Date   HGBA1C 5.3 05/10/2022   No results found for: "VITAMINB12" No results found for: "TSH"   04/06/2023 Normal brain MRI. [I reviewed images myself and agree with interpretation. -VRP]    ASSESSMENT AND PLAN  53 y.o. year old female here with:  Meds tried: imitrex  Dx:  1. Migraine with aura and without status migrainosus, not intractable   2. Complicated migraine      PLAN:  MIGRAINE WITH AURA (since high school; 2 major events in 2015 and 2025 with vision changes, brain fog / confusion symptoms likely complicated migraine)  - monitor symptoms; consider topiramate if migraines increase   Return for pending if symptoms worsen or fail to improve.    Omega Bible, MD 07/20/2023, 10:11 AM Certified in Neurology, Neurophysiology and Neuroimaging  [facility] [address] [place], [place] [address] [phone]

## 2023-07-20 NOTE — Patient Instructions (Signed)
  MIGRAINE WITH AURA (since high school; 2 major events in 2015 and 2025 with vision, brain fog / confusion symptoms)  - monitor symptoms; consider topiramate if migraines increase
